# Patient Record
Sex: Female | Born: 1975 | Race: White | Hispanic: No | Marital: Married | Smoking: Former smoker
Health system: Southern US, Community
[De-identification: ages and names within clinical notes are randomized; demographics above are authoritative.]

## PROBLEM LIST (undated history)

## (undated) ENCOUNTER — Inpatient Hospital Stay (HOSPITAL_COMMUNITY): Payer: Self-pay

## (undated) DIAGNOSIS — I10 Essential (primary) hypertension: Secondary | ICD-10-CM

## (undated) DIAGNOSIS — F32A Depression, unspecified: Secondary | ICD-10-CM

## (undated) DIAGNOSIS — R87629 Unspecified abnormal cytological findings in specimens from vagina: Secondary | ICD-10-CM

## (undated) DIAGNOSIS — E785 Hyperlipidemia, unspecified: Secondary | ICD-10-CM

## (undated) DIAGNOSIS — D649 Anemia, unspecified: Secondary | ICD-10-CM

## (undated) DIAGNOSIS — T7840XA Allergy, unspecified, initial encounter: Secondary | ICD-10-CM

## (undated) HISTORY — PX: WISDOM TOOTH EXTRACTION: SHX21

## (undated) HISTORY — PX: LEEP: SHX91

## (undated) HISTORY — DX: Depression, unspecified: F32.A

## (undated) HISTORY — DX: Allergy, unspecified, initial encounter: T78.40XA

## (undated) HISTORY — DX: Hyperlipidemia, unspecified: E78.5

## (undated) HISTORY — PX: COLPOSCOPY: SHX161

## (undated) HISTORY — DX: Essential (primary) hypertension: I10

## (undated) HISTORY — DX: Unspecified abnormal cytological findings in specimens from vagina: R87.629

---

## 2005-03-27 ENCOUNTER — Other Ambulatory Visit: Admission: RE | Admit: 2005-03-27 | Discharge: 2005-03-27 | Payer: Self-pay | Admitting: Family Medicine

## 2006-04-03 ENCOUNTER — Other Ambulatory Visit: Admission: RE | Admit: 2006-04-03 | Discharge: 2006-04-03 | Payer: Self-pay | Admitting: *Deleted

## 2007-06-17 ENCOUNTER — Other Ambulatory Visit: Admission: RE | Admit: 2007-06-17 | Discharge: 2007-06-17 | Payer: Self-pay | Admitting: *Deleted

## 2008-05-07 ENCOUNTER — Other Ambulatory Visit: Admission: RE | Admit: 2008-05-07 | Discharge: 2008-05-07 | Payer: Self-pay | Admitting: Gynecology

## 2009-01-05 DIAGNOSIS — J45909 Unspecified asthma, uncomplicated: Secondary | ICD-10-CM | POA: Insufficient documentation

## 2009-05-25 ENCOUNTER — Inpatient Hospital Stay (HOSPITAL_COMMUNITY): Admission: AD | Admit: 2009-05-25 | Discharge: 2009-05-30 | Payer: Self-pay | Admitting: Obstetrics and Gynecology

## 2011-03-05 LAB — COMPREHENSIVE METABOLIC PANEL
AST: 29 U/L (ref 0–37)
Albumin: 2.4 g/dL — ABNORMAL LOW (ref 3.5–5.2)
Calcium: 8.5 mg/dL (ref 8.4–10.5)
Creatinine, Ser: 0.65 mg/dL (ref 0.4–1.2)
GFR calc Af Amer: >60 mL/min (ref >60)
GFR calc non Af Amer: >60 mL/min (ref >60)
Sodium: 134 mEq/L — ABNORMAL LOW (ref 135–145)
Total Protein: 5.9 g/dL — ABNORMAL LOW (ref 6.0–8.3)

## 2011-03-05 LAB — CBC
HCT: 26.5 % — ABNORMAL LOW (ref 36.0–46.0)
Hemoglobin: 9.2 g/dL — ABNORMAL LOW (ref 12.0–15.0)
MCHC: 35 g/dL (ref 30.0–36.0)
MCV: 91.2 fL (ref 78.0–100.0)
MCV: 91.6 fL (ref 78.0–100.0)
Platelets: 224 10*3/uL (ref 150–400)
RBC: 2.89 MIL/uL — ABNORMAL LOW (ref 3.87–5.11)
RDW: 15.5 % (ref 11.5–15.5)
WBC: 9.1 10*3/uL (ref 4.0–10.5)

## 2011-03-06 LAB — CBC
HCT: 31.6 % — ABNORMAL LOW (ref 36.0–46.0)
MCHC: 35.1 g/dL (ref 30.0–36.0)
MCV: 90.6 fL (ref 78.0–100.0)
Platelets: 293 10*3/uL (ref 150–400)
Platelets: 296 10*3/uL (ref 150–400)
RDW: 15.1 % (ref 11.5–15.5)
WBC: 9.6 10*3/uL (ref 4.0–10.5)

## 2011-03-06 LAB — URIC ACID: Uric Acid, Serum: 5.2 mg/dL (ref 2.4–7.0)

## 2011-03-06 LAB — COMPREHENSIVE METABOLIC PANEL
Alkaline Phosphatase: 123 U/L — ABNORMAL HIGH (ref 39–117)
BUN: 6 mg/dL (ref 6–23)
Glucose, Bld: 99 mg/dL (ref 70–99)
Potassium: 3.7 mEq/L (ref 3.5–5.1)
Total Bilirubin: 0.7 mg/dL (ref 0.3–1.2)
Total Protein: 6.2 g/dL (ref 6.0–8.3)

## 2011-03-06 LAB — LACTATE DEHYDROGENASE: LDH: 95 U/L (ref 94–250)

## 2011-04-11 NOTE — Op Note (Signed)
[name], [name] NO.:  [id number]   MEDICAL RECORD NO.:  [id number]          PATIENT TYPE:  INP   LOCATION:  9113                          FACILITY:  [facility]   PHYSICIAN:  Freddy Finner, M.D.   DATE OF BIRTH:  Aug 12, 1976   DATE OF PROCEDURE:  05/27/2009  DATE OF DISCHARGE:                               OPERATIVE REPORT   PREOPERATIVE DIAGNOSES:  1. Intrauterine pregnancy at [redacted] weeks gestation.  2. Chronic hypertension.  3. Mild superimposed pregnancy-induced hypertension.  4. Nonreassuring fetal heart tracing.   POSTOPERATIVE DIAGNOSES:  1. Intrauterine pregnancy at [redacted] weeks gestation.  2. Chronic hypertension.  3. Mild superimposed pregnancy-induced hypertension.  4. Nonreassuring fetal heart tracing.  5. Delivered viable female infant, Apgars of 8/9.  6. __________arterial cord pH 7.30.   ANESTHESIA:  Epidural.   ESTIMATED INTRAOPERATIVE BLOOD LOSS:  800 mL.   INTRAOPERATIVE COMPLICATIONS:  None.   The patient is a 35 year old primigravida who has been followed through  a prenatal course remarkable for chronic hypertension with third  trimester addition of antihypertensive medication secondary to mild  pregnancy-induced hypertension.  She was admitted on May 25, 2009, for  a two-stage induction and received Cytotec through the night and on the  morning May 26, 2009, was started on IV Pitocin.  At approximately 2:30  p.m., she spontaneous ruptured her membranes and progressed to complete  dilatation and early second stage.  There were periods during the first  stage of labor when the patient had moderate variables.  These responded  to IV ephedrine, positioning and later to an intrauterine transducer  with amnioinfusion.  Her examination revealed the vertex to be in a +1  station with molding and vertex in the left occiput anterior position.  This was true at approximately 11:00 p.m. and again at 12:15 a.m. on the  day of delivery when she was  reexamined.  She had, had a severe  deceleration for a period of 2-3 minutes with slow recovery.  By the  time of my arrival at the hospital, the tracing had returned to her  normal baseline.  There was still some variability, but based on the  patient's pelvis, the position of the head with molding, the concern  over a long second stage it was felt prudent to proceed with cesarean  delivery and she was brought to the operating room.  The epidural which  was in place was dosed for surgery.  Foley catheter was indwelling.  The  abdomen was prepped and draped in the usual fashion.  A low abdominal  transverse skin incision was made approximately 2 cm above the  symphysis.  This was dissected down to the fascia which was entered  sharply and extended to the extent of the skin incision.  The rectus  sheath was developed superiorly and inferiorly with blunt and sharp  dissection.  Rectus muscles divided in the midline.  Peritoneum was  elevated and entered sharply and extended bluntly to the extent of the  skin incision.  A bladder blade was placed.  The bladder was very low  and was  it elected to make a transverse incision in the lower segment  above the bladder reflection.  Amnion was entered.  Fluid was clear.  The uterine incision was dissected transversely with blunt dissection.  A viable female infant was then delivered with statistics as noted above.  This was done without difficulty.  There was no obvious intrauterine  reason for the variable decelerations.  There was no nuchal cord, no  shoulder cord, no entanglement of the cord that could be appreciated.  Arterial cord blood was obtained for sampling.  The placenta was  delivered and the placenta donated for cord blood harvesting.  The  uterus was delivered onto the anterior abdominal wall.  There was a  small subserosal myoma on the anterior surface, otherwise the uterus was  normal.  The tubes and ovaries were normal.  Her uterine  cavity was  carefully manually explored and all parts of conception removed.  The  uterine incision was then closed with a double layer with running  locking 0 Monocryl for the first layer and an imbricating suture of 0  Monocryl for the second.  The uterus was delivered back into the  abdominal cavity.  Irrigation was carried out.  Hemostasis was  confirmed.  Pack, needle and instrument counts were correct.  The  abdominal incision was then closed in layers.  Running 0 Monocryl was  used to close the peritoneum and reapproximate the rectus muscles.  The  fascia was closed with a looped 0 PDS in a running fashion.  The  subcutaneous tissue was approximated with a running 2-0 plain.  The skin  was closed with wide skin staples and quarter-inch Steri-Strips.  The  patient tolerated the operative procedure well.  She was taken to the  recovery room in good condition.      Freddy Finner, M.D.  Electronically Signed     WRN/MEDQ  D:  05/27/2009  T:  05/27/2009  Job:  [id number]

## 2011-04-11 NOTE — H&P (Signed)
[name], [name] NO.:  [id number]   MEDICAL RECORD NO.:  [id number]          PATIENT TYPE:  INP   LOCATION:                                FACILITY[facility]  [facility]   PHYSICIAN:  Freddy Finner, M.D.   DATE OF BIRTH:  May 06, 1976   DATE OF ADMISSION:  05/25/2009  DATE OF DISCHARGE:                              HISTORY & PHYSICAL   ADMITTING DIAGNOSIS:  Intrauterine pregnancy 39 weeks' gestation; mild  pregnancy-induced hypertension with overlying chronic hypertension  admitted now for induction for this indication.   The patient is a 35 year old primigravida whose prenatal course has been  remarkable for hypertension which is thought to be chronic in nature and  which progressively worsened during the course of pregnancy, at which  time she was started on labetalol 100 mg b.i.d. at 31-5/7 weeks'  gestation.  At that time, her blood pressure was 142/90.  PIH labs at  that time were essentially normal, and her pressure remained in a  moderate range until approximately 2 weeks prior to this admission.  The  day of admission, her blood pressure in the office is 148/82.  She had  1+ proteinuria.  Her most recent PIH labs have been normal but will be  repeated on admission.   REVIEW OF SYSTEMS:  At this time is pretty much negative.  She does  occasionally have a mild headache but no severe headaches.  No unusual  abdominal pain.  She does have marked peripheral edema.  She has 1+  proteinuria and the blood pressure noted above.  She is now admitted for  two-stage induction.   PAST HISTORY:  As recorded in prenatal summary and will not be repeated  here.  The patient is known to be group B Strep positive.  She is  allergic to PENICILLIN.   ADMISSION PHYSICAL EXAMINATION:  HEENT:  Grossly within normal limits.  VITAL SIGNS:  As noted above, blood pressure is 148/82.  NECK:  Thyroid gland is not palpably enlarged.  GENERAL:  The patient is moderately obese.  CHEST:  Her  chest is clear to auscultation throughout.  HEART:  Normal sinus rhythm without murmurs, rubs or gallops.  ABDOMEN:  Gravid.  Fundal height of 40 cm.  Estimated fetal size of 8  pounds.  EXTREMITIES:  There is +2 to +3 edema below the knee.  Deep tendon  reflexes +1.  PELVIC:  Cervix is closed, perhaps 25% effaced, posterior.   ASSESSMENT:  Chronic hypertension with secondary superimposed pregnancy-  induced hypertension, relative macrosomia, admitted now for two-stage  induction.  Known positive group B Strep which we will cover with  clindamycin.   PLAN:  Cytotec induction with __________ on the day following with IV  Pitocin.     Freddy Finner, M.D.  Electronically Signed    WRN/MEDQ  D:  05/25/2009  T:  05/25/2009  Job:  [id number]

## 2011-04-14 NOTE — Discharge Summary (Signed)
NAMEELVI, [name] NO.:  [id number]   MEDICAL RECORD NO.:  [id number]          PATIENT TYPE:  INP   LOCATION:  9113                          [facility]ACILITY[facility]  [facility]   PHYSICIAN:  Dineen Kid. Rana Snare, M.D.    DATE OF BIRTH:  July 20, 1976   DATE OF ADMISSION:  05/25/2009  DATE OF DISCHARGE:  05/30/2009                               DISCHARGE SUMMARY   ADMITTING DIAGNOSES:  1. Intrauterine pregnancy at 5 weeks' estimated gestational age.  2. Mild pregnancy-induced hypertension with overlying chronic      hypertension.  3. Induction of labor.   DISCHARGE DIAGNOSES:  1. Status post low transverse cesarean section secondary to      nonreassuring fetal heart tones.  2. Viable female infant.   PROCEDURE:  Primary low transverse cesarean section.   REASON FOR ADMISSION:  Please see dictated H and P.   HOSPITAL COURSE:  The patient is a 35 year old primigravida who was  admitted to [facility] for two-stage induction of labor  secondary to pregnancy-induced hypertension.  On admission, the patient  had PIH labs which were within normal limits.  Vital signs were stable.  Blood pressure 148/82.  Cervix was closed, approximately 25% effaced,  and posterior position.  The patient was also known to be positive for  group B beta strep.  She was started on some clindamycin.  On the  following morning, the patient was started on some Pitocin.  Fetal heart  tones were reactive with a low baseline.  Cervix was approximately 80%  effaced, vertex at minus 3 station.  The patient did sustain spontaneous  rupture of membranes at approximately 2:30.  fluid was clear.  Epidural  was placed at that time for her comfort.  She was at approximately 5 cm,  100% effaced, vertex at minus 1 station.  Fetal heart tones were noted  to have some variable decelerations.  Pitocin was discontinued.  The  patient was repositioned, and IV fluids were given.  Amnioinfusion was  performed.  Pitocin  was then restarted, and the patient did achieve  complete dilatation.  Vertex was noted to have some molding at  approximately 1+ station.  There continued to be some nonreassuring  fetal heart tones, and the patient was not making any further progress.  Decision was made to proceed with a primary low transverse cesarean  section.  The patient was then transferred to the operating room where  epidural was dosed to an adequate surgical level.  A low transverse  incision was made with delivery of a viable female infant with Apgars of 8  at 1 minute and 9 at 5 minutes.  Arterial cord pH was 7.30.  The patient  tolerated the procedure well and was taken to the recovery room in  stable condition.  On postoperative day 1, the patient was without  complaint.  Vital signs were stable.  She was afebrile.  Abdomen soft.  Fundus firm and nontender.  Dressing was clean, dry, and intact.  Urine  output was noted to be adequate.  On postoperative day 2, the patient  was  without complaint.  Vital signs were stable.  Blood pressures 119/79  to 127/79.  Fundus was firm and nontender.  Abdominal dressing was  clean, dry, and intact.  Laboratory findings revealed hemoglobin of 8.9,  platelet count of 224,000.  On the following morning, the patient noted  that she was feeling somewhat anxious.  She did have a history of  cigarette smoking and felt that some of her anxiety was coming from  nicotine withdrawal.  The patient was offered a nicotine patch.  She  denied any postpartum depression symptoms.  Later that afternoon, the  patient complained of a headache, rate of 6-7/10.  Blood pressure was  noted to be 162/93.  She denied any blurred vision or right upper  quadrant pain.  PIH labs were redrawn which were within normal limits.  The patient was started on some labetalol 100 mg b.i.d.  On the  following morning, the patient was without complaint.  Vital signs were  stable.  Blood pressure was 152/93.   Abdomen soft.  Fundus firm and  nontender.  Deep tendon reflexes were within normal limits.  Repeat PIH  labs were within normal limits.  Discharge instructions were reviewed.  Staples were removed, and the patient was later discharged home.   CONDITION ON DISCHARGE:  Stable.   DIET:  Regular as tolerated.   ACTIVITY:  No heavy lifting, no driving x2 weeks, no vaginal entry.   FOLLOWUP:  The patient to follow up in the office in 1 week for blood  pressure check and an incision check.  She is to call for temperature  greater than 100 degrees, persistent nausea, vomiting, heavy vaginal  bleeding, and/or redness or drainage from the incisional site.  The  patient was also instructed to call for headache, blurred vision, or  right upper quadrant pain.   DISCHARGE MEDICATIONS:  1. Tylox #30 one p.o. every 4-6 hours p.r.n.  2. Labetalol 100 mg 1 p.o. b.i.d.  3. Prenatal vitamins 1 p.o. daily.  4. Colace 1 p.o. daily p.r.n.      Julio Sicks, N.P.      Dineen Kid Rana Snare, M.D.  Electronically Signed    CC/MEDQ  D:  06/10/2009  T:  06/10/2009  Job:  [id number]

## 2011-09-08 LAB — OB RESULTS CONSOLE RPR: RPR: NONREACTIVE

## 2011-09-08 LAB — OB RESULTS CONSOLE GC/CHLAMYDIA: Chlamydia: NEGATIVE

## 2012-03-24 ENCOUNTER — Encounter (HOSPITAL_COMMUNITY): Payer: Self-pay

## 2012-03-27 ENCOUNTER — Encounter (HOSPITAL_COMMUNITY): Payer: Self-pay

## 2012-03-28 ENCOUNTER — Inpatient Hospital Stay (HOSPITAL_COMMUNITY)
Admission: AD | Admit: 2012-03-28 | Discharge: 2012-03-28 | Disposition: A | Discharge status: Hospice | Payer: 59 | Source: Ambulatory Visit | Attending: Obstetrics and Gynecology | Admitting: Obstetrics and Gynecology

## 2012-03-28 ENCOUNTER — Encounter (HOSPITAL_COMMUNITY)
Admission: RE | Admit: 2012-03-28 | Discharge: 2012-03-28 | Disposition: A | Discharge status: Home / Self Care | Payer: 59 | Source: Ambulatory Visit | Attending: Obstetrics and Gynecology | Admitting: Obstetrics and Gynecology

## 2012-03-28 ENCOUNTER — Encounter (HOSPITAL_COMMUNITY): Payer: Self-pay

## 2012-03-28 DIAGNOSIS — O99891 Other specified diseases and conditions complicating pregnancy: Secondary | ICD-10-CM | POA: Insufficient documentation

## 2012-03-28 DIAGNOSIS — Y92410 Unspecified street and highway as the place of occurrence of the external cause: Secondary | ICD-10-CM | POA: Insufficient documentation

## 2012-03-28 DIAGNOSIS — Z3689 Encounter for other specified antenatal screening: Secondary | ICD-10-CM

## 2012-03-28 DIAGNOSIS — O479 False labor, unspecified: Secondary | ICD-10-CM

## 2012-03-28 HISTORY — DX: Anemia, unspecified: D64.9

## 2012-03-28 LAB — SURGICAL PCR SCREEN: Staphylococcus aureus: INVALID — AB

## 2012-03-28 NOTE — Progress Notes (Signed)
Pre-op nurse in to discuss scheduled C/S.

## 2012-03-28 NOTE — Patient Instructions (Addendum)
YOUR PROCEDURE IS SCHEDULED ON:04/10/12  ENTER THROUGH THE MAIN ENTRANCE OF [facility] AT:1130  USE DESK PHONE AND DIAL [id number] TO INFORM us OF YOUR ARRIVAL  CALL [phone] IF YOU HAVE ANY QUESTIONS OR PROBLEMS PRIOR TO YOUR ARRIVAL.  REMEMBER: DO NOT EAT AFTER MIDNIGHT :TUESDAY  SPECIAL INSTRUCTIONS:CLEAR LIQUIDS OK UNTIL 9AM WED.   YOU MAY BRUSH YOUR TEETH THE MORNING OF SURGERY   TAKE THESE MEDICINES THE DAY OF SURGERY WITH SIP OF WATER:NONE- BRING INHALER TO HOSPITAL   DO NOT WEAR JEWELRY, EYE MAKEUP, LIPSTICK OR DARK FINGERNAIL POLISH DO NOT WEAR LOTIONS  DO NOT SHAVE FOR 48 HOURS PRIOR TO SURGERY  YOU WILL NOT BE ALLOWED TO DRIVE YOURSELF HOME.  NAME OF DRIVER:RYAN- SPOUSE
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
## 2012-03-29 ENCOUNTER — Encounter (HOSPITAL_COMMUNITY): Payer: Self-pay

## 2012-03-29 NOTE — Pre-Procedure Instructions (Signed)
Labs will need to be done stat on DOS. I didn't order them again because Dr. Renaldo Fiddler should be ordering labs, etc soon.
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
## 2012-03-30 LAB — MRSA CULTURE: Culture: NO GROWTH

## 2012-04-09 NOTE — H&P (Addendum)
35yo G5P1 @ 39+ wks presents for repeat c-section  PMHx:  Asthma, GAD PSHx:  EAB x 2, c-section x 1 All:  PCN, Keflex Meds:  PNV, ventolin prn Shx:  Negative for tobacco, ivdu, etoh   AF, VSS GEn - NAD Abd - gravid, NT Ext - edema bilaterally  A/P:  Prior c-section, desires repeat R/b/a discussed and informed consent. Plan of care discussed again, questions answered.

## 2012-04-10 ENCOUNTER — Encounter (HOSPITAL_COMMUNITY): Payer: Self-pay | Admitting: Anesthesiology

## 2012-04-10 ENCOUNTER — Inpatient Hospital Stay (HOSPITAL_COMMUNITY)
Admission: RE | Admit: 2012-04-10 | Discharge: 2012-04-13 | DRG: 766 | Disposition: A | Discharge status: Home / Self Care | Payer: 59 | Source: Ambulatory Visit | Attending: Obstetrics and Gynecology | Admitting: Obstetrics and Gynecology

## 2012-04-10 ENCOUNTER — Encounter (HOSPITAL_COMMUNITY)
Admission: RE | Disposition: A | Discharge status: Home / Self Care | Payer: Self-pay | Source: Ambulatory Visit | Attending: Obstetrics and Gynecology

## 2012-04-10 ENCOUNTER — Encounter (HOSPITAL_COMMUNITY): Payer: Self-pay | Admitting: General Surgery

## 2012-04-10 ENCOUNTER — Encounter (HOSPITAL_COMMUNITY): Payer: Self-pay | Admitting: *Deleted

## 2012-04-10 ENCOUNTER — Inpatient Hospital Stay (HOSPITAL_COMMUNITY): Payer: 59 | Admitting: Anesthesiology

## 2012-04-10 DIAGNOSIS — O34219 Maternal care for unspecified type scar from previous cesarean delivery: Principal | ICD-10-CM | POA: Diagnosis present

## 2012-04-10 LAB — CBC
HCT: 37.5 % (ref 36.0–46.0)
Hemoglobin: 12.8 g/dL (ref 12.0–15.0)
MCHC: 34.1 g/dL (ref 30.0–36.0)
RBC: 4.14 MIL/uL (ref 3.87–5.11)
WBC: 13.3 10*3/uL — ABNORMAL HIGH (ref 4.0–10.5)

## 2012-04-10 LAB — RPR: RPR Ser Ql: NONREACTIVE

## 2012-04-10 SURGERY — Surgical Case
Anesthesia: Spinal | Site: Abdomen | Wound class: Clean Contaminated

## 2012-04-10 MED ORDER — SODIUM CHLORIDE 0.9 % IV SOLN: 1.0000 ug/kg/h | INTRAVENOUS | Status: DC | PRN

## 2012-04-10 MED ORDER — OXYTOCIN 20 UNITS IN LACTATED RINGERS INFUSION - SIMPLE: 125.0000 mL/h | INTRAVENOUS | Status: AC

## 2012-04-10 MED ORDER — OXYTOCIN 10 UNIT/ML IJ SOLN
INTRAMUSCULAR | Status: AC
  Filled 2012-04-10: qty 4

## 2012-04-10 MED ORDER — DIPHENHYDRAMINE HCL 25 MG PO CAPS: 25.0000 mg | ORAL_CAPSULE | Freq: Four times a day (QID) | ORAL | Status: DC | PRN

## 2012-04-10 MED ORDER — MEPERIDINE HCL 25 MG/ML IJ SOLN: 6.2500 mg | INTRAMUSCULAR | Status: DC | PRN

## 2012-04-10 MED ORDER — SCOPOLAMINE 1 MG/3DAYS TD PT72
1.0000 | MEDICATED_PATCH | Freq: Once | TRANSDERMAL | Status: DC
  Administered 2012-04-10: 1.5 mg via TRANSDERMAL

## 2012-04-10 MED ORDER — OXYTOCIN 20 UNITS IN LACTATED RINGERS INFUSION - SIMPLE
INTRAVENOUS | Status: DC | PRN
  Administered 2012-04-10: 20 [IU] via INTRAVENOUS

## 2012-04-10 MED ORDER — SIMETHICONE 80 MG PO CHEW
80.0000 mg | CHEWABLE_TABLET | ORAL | Status: DC | PRN
  Administered 2012-04-10: 80 mg via ORAL

## 2012-04-10 MED ORDER — LACTATED RINGERS IV SOLN
INTRAVENOUS | Status: DC
  Administered 2012-04-10: 13:00:00 via INTRAVENOUS

## 2012-04-10 MED ORDER — SCOPOLAMINE 1 MG/3DAYS TD PT72
MEDICATED_PATCH | TRANSDERMAL | Status: AC
  Administered 2012-04-10: 1.5 mg via TRANSDERMAL
  Filled 2012-04-10: qty 1

## 2012-04-10 MED ORDER — ONDANSETRON HCL 4 MG/2ML IJ SOLN: 4.0000 mg | INTRAMUSCULAR | Status: DC | PRN

## 2012-04-10 MED ORDER — BUPIVACAINE IN DEXTROSE 0.75-8.25 % IT SOLN
INTRATHECAL | Status: DC | PRN
  Administered 2012-04-10: 11.75 mg via INTRATHECAL

## 2012-04-10 MED ORDER — FENTANYL CITRATE 0.05 MG/ML IJ SOLN
INTRAMUSCULAR | Status: DC | PRN
  Administered 2012-04-10: 15 ug via INTRATHECAL

## 2012-04-10 MED ORDER — IBUPROFEN 600 MG PO TABS
600.0000 mg | ORAL_TABLET | Freq: Four times a day (QID) | ORAL | Status: DC
  Administered 2012-04-11 – 2012-04-13 (×10): 600 mg via ORAL
  Filled 2012-04-10 (×10): qty 1

## 2012-04-10 MED ORDER — 0.9 % SODIUM CHLORIDE (POUR BTL) OPTIME
TOPICAL | Status: DC | PRN
  Administered 2012-04-10: 1000 mL

## 2012-04-10 MED ORDER — ONDANSETRON HCL 4 MG PO TABS: 4.0000 mg | ORAL_TABLET | ORAL | Status: DC | PRN

## 2012-04-10 MED ORDER — DIPHENHYDRAMINE HCL 25 MG PO CAPS: 25.0000 mg | ORAL_CAPSULE | ORAL | Status: DC | PRN

## 2012-04-10 MED ORDER — SCOPOLAMINE 1 MG/3DAYS TD PT72: 1.0000 | MEDICATED_PATCH | Freq: Once | TRANSDERMAL | Status: DC

## 2012-04-10 MED ORDER — MORPHINE SULFATE (PF) 0.5 MG/ML IJ SOLN
INTRAMUSCULAR | Status: DC | PRN
  Administered 2012-04-10: .1 mg via INTRATHECAL

## 2012-04-10 MED ORDER — ONDANSETRON HCL 4 MG/2ML IJ SOLN
INTRAMUSCULAR | Status: AC
  Filled 2012-04-10: qty 2

## 2012-04-10 MED ORDER — ONDANSETRON HCL 4 MG/2ML IJ SOLN
INTRAMUSCULAR | Status: DC | PRN
  Administered 2012-04-10: 4 mg via INTRAVENOUS

## 2012-04-10 MED ORDER — LACTATED RINGERS IV SOLN
INTRAVENOUS | Status: DC | PRN
  Administered 2012-04-10 (×2): via INTRAVENOUS

## 2012-04-10 MED ORDER — SODIUM CHLORIDE 0.9 % IJ SOLN: 3.0000 mL | INTRAMUSCULAR | Status: DC | PRN

## 2012-04-10 MED ORDER — KETOROLAC TROMETHAMINE 60 MG/2ML IM SOLN
60.0000 mg | Freq: Once | INTRAMUSCULAR | Status: AC | PRN
  Administered 2012-04-10: 60 mg via INTRAMUSCULAR

## 2012-04-10 MED ORDER — TETANUS-DIPHTH-ACELL PERTUSSIS 5-2.5-18.5 LF-MCG/0.5 IM SUSP: 0.5000 mL | Freq: Once | INTRAMUSCULAR | Status: DC

## 2012-04-10 MED ORDER — PHENYLEPHRINE HCL 10 MG/ML IJ SOLN
INTRAMUSCULAR | Status: DC | PRN
  Administered 2012-04-10: 80 ug via INTRAVENOUS
  Administered 2012-04-10: 40 ug via INTRAVENOUS
  Administered 2012-04-10 (×3): 80 ug via INTRAVENOUS
  Administered 2012-04-10: 40 ug via INTRAVENOUS

## 2012-04-10 MED ORDER — KETOROLAC TROMETHAMINE 30 MG/ML IJ SOLN: 30.0000 mg | Freq: Four times a day (QID) | INTRAMUSCULAR | Status: AC | PRN

## 2012-04-10 MED ORDER — MEASLES, MUMPS & RUBELLA VAC ~~LOC~~ INJ: 0.5000 mL | INJECTION | Freq: Once | SUBCUTANEOUS | Status: DC

## 2012-04-10 MED ORDER — FENTANYL CITRATE 0.05 MG/ML IJ SOLN
25.0000 ug | INTRAMUSCULAR | Status: DC | PRN
  Administered 2012-04-10 (×2): 50 ug via INTRAVENOUS

## 2012-04-10 MED ORDER — PHENYLEPHRINE 40 MCG/ML (10ML) SYRINGE FOR IV PUSH (FOR BLOOD PRESSURE SUPPORT)
PREFILLED_SYRINGE | INTRAVENOUS | Status: AC
  Filled 2012-04-10: qty 5

## 2012-04-10 MED ORDER — LANOLIN HYDROUS EX OINT: 1.0000 "application " | TOPICAL_OINTMENT | CUTANEOUS | Status: DC | PRN

## 2012-04-10 MED ORDER — OXYCODONE-ACETAMINOPHEN 5-325 MG PO TABS
1.0000 | ORAL_TABLET | ORAL | Status: DC | PRN
  Administered 2012-04-11 (×5): 2 via ORAL
  Administered 2012-04-12: 1 via ORAL
  Administered 2012-04-12: 2 via ORAL
  Administered 2012-04-12 (×2): 1 via ORAL
  Filled 2012-04-10 (×2): qty 2
  Filled 2012-04-10: qty 1
  Filled 2012-04-10: qty 2
  Filled 2012-04-10: qty 1
  Filled 2012-04-10 (×2): qty 2
  Filled 2012-04-10 (×3): qty 1

## 2012-04-10 MED ORDER — METOCLOPRAMIDE HCL 5 MG/ML IJ SOLN: 10.0000 mg | Freq: Three times a day (TID) | INTRAMUSCULAR | Status: DC | PRN

## 2012-04-10 MED ORDER — WITCH HAZEL-GLYCERIN EX PADS
1.0000 | MEDICATED_PAD | CUTANEOUS | Status: DC | PRN
Start: 2012-04-10 — End: 2012-04-13

## 2012-04-10 MED ORDER — NALBUPHINE HCL 10 MG/ML IJ SOLN: 5.0000 mg | INTRAMUSCULAR | Status: DC | PRN

## 2012-04-10 MED ORDER — SENNOSIDES-DOCUSATE SODIUM 8.6-50 MG PO TABS
2.0000 | ORAL_TABLET | Freq: Every day | ORAL | Status: DC
  Administered 2012-04-11 – 2012-04-12 (×2): 2 via ORAL

## 2012-04-10 MED ORDER — ALBUTEROL SULFATE HFA 108 (90 BASE) MCG/ACT IN AERS: 2.0000 | INHALATION_SPRAY | Freq: Four times a day (QID) | RESPIRATORY_TRACT | Status: DC | PRN

## 2012-04-10 MED ORDER — DIPHENHYDRAMINE HCL 50 MG/ML IJ SOLN: 25.0000 mg | INTRAMUSCULAR | Status: DC | PRN

## 2012-04-10 MED ORDER — VANCOMYCIN HCL 1000 MG IV SOLR
1000.0000 mg | INTRAVENOUS | Status: DC | PRN
  Administered 2012-04-10: 1000 mg via INTRAVENOUS

## 2012-04-10 MED ORDER — DIBUCAINE 1 % RE OINT: 1.0000 "application " | TOPICAL_OINTMENT | RECTAL | Status: DC | PRN

## 2012-04-10 MED ORDER — FENTANYL CITRATE 0.05 MG/ML IJ SOLN
INTRAMUSCULAR | Status: AC
  Administered 2012-04-10: 50 ug via INTRAVENOUS
  Filled 2012-04-10: qty 2

## 2012-04-10 MED ORDER — DEXTROSE IN LACTATED RINGERS 5 % IV SOLN: INTRAVENOUS | Status: DC

## 2012-04-10 MED ORDER — NALOXONE HCL 0.4 MG/ML IJ SOLN: 0.4000 mg | INTRAMUSCULAR | Status: DC | PRN

## 2012-04-10 MED ORDER — FENTANYL CITRATE 0.05 MG/ML IJ SOLN
INTRAMUSCULAR | Status: AC
  Filled 2012-04-10: qty 4

## 2012-04-10 MED ORDER — MEDROXYPROGESTERONE ACETATE 150 MG/ML IM SUSP: 150.0000 mg | INTRAMUSCULAR | Status: DC | PRN

## 2012-04-10 MED ORDER — KETOROLAC TROMETHAMINE 30 MG/ML IJ SOLN
30.0000 mg | Freq: Four times a day (QID) | INTRAMUSCULAR | Status: AC | PRN
  Administered 2012-04-10: 30 mg via INTRAVENOUS
  Filled 2012-04-10: qty 1

## 2012-04-10 MED ORDER — ONDANSETRON HCL 4 MG/2ML IJ SOLN: 4.0000 mg | Freq: Three times a day (TID) | INTRAMUSCULAR | Status: DC | PRN

## 2012-04-10 MED ORDER — KETOROLAC TROMETHAMINE 60 MG/2ML IM SOLN
INTRAMUSCULAR | Status: AC
  Administered 2012-04-10: 60 mg via INTRAMUSCULAR
  Filled 2012-04-10: qty 2

## 2012-04-10 MED ORDER — VANCOMYCIN HCL IN DEXTROSE 1-5 GM/200ML-% IV SOLN
1000.0000 mg | Freq: Once | INTRAVENOUS | Status: DC
  Filled 2012-04-10: qty 200

## 2012-04-10 MED ORDER — PNEUMOCOCCAL VAC POLYVALENT 25 MCG/0.5ML IJ INJ
0.5000 mL | INJECTION | INTRAMUSCULAR | Status: AC
  Administered 2012-04-11: 0.5 mL via INTRAMUSCULAR
  Filled 2012-04-10: qty 0.5

## 2012-04-10 MED ORDER — SIMETHICONE 80 MG PO CHEW
80.0000 mg | CHEWABLE_TABLET | Freq: Three times a day (TID) | ORAL | Status: DC
  Administered 2012-04-11 – 2012-04-13 (×9): 80 mg via ORAL

## 2012-04-10 MED ORDER — EPHEDRINE 5 MG/ML INJ
INTRAVENOUS | Status: AC
  Filled 2012-04-10: qty 10

## 2012-04-10 MED ORDER — IBUPROFEN 600 MG PO TABS: 600.0000 mg | ORAL_TABLET | Freq: Four times a day (QID) | ORAL | Status: DC | PRN

## 2012-04-10 MED ORDER — PRENATAL MULTIVITAMIN CH
1.0000 | ORAL_TABLET | Freq: Every day | ORAL | Status: DC
  Administered 2012-04-11 – 2012-04-13 (×3): 1 via ORAL
  Filled 2012-04-10 (×3): qty 1

## 2012-04-10 MED ORDER — MENTHOL 3 MG MT LOZG: 1.0000 | LOZENGE | OROMUCOSAL | Status: DC | PRN

## 2012-04-10 MED ORDER — OXYTOCIN 20 UNITS IN LACTATED RINGERS INFUSION - SIMPLE
INTRAVENOUS | Status: AC
  Administered 2012-04-10: 20 [IU]
  Filled 2012-04-10: qty 1000

## 2012-04-10 MED ORDER — DIPHENHYDRAMINE HCL 50 MG/ML IJ SOLN: 12.5000 mg | INTRAMUSCULAR | Status: DC | PRN

## 2012-04-10 MED ORDER — MORPHINE SULFATE 0.5 MG/ML IJ SOLN
INTRAMUSCULAR | Status: AC
  Filled 2012-04-10: qty 10

## 2012-04-10 SURGICAL SUPPLY — 29 items
CHLORAPREP W/TINT 26ML (MISCELLANEOUS) ×2 IMPLANT
CLOTH BEACON ORANGE TIMEOUT ST (SAFETY) ×2 IMPLANT
DRSG COVADERM 4X10 (GAUZE/BANDAGES/DRESSINGS) ×1 IMPLANT
ELECT REM PT RETURN 9FT ADLT (ELECTROSURGICAL) ×2
ELECTRODE REM PT RTRN 9FT ADLT (ELECTROSURGICAL) ×1 IMPLANT
EXTRACTOR VACUUM M CUP 4 TUBE (SUCTIONS) IMPLANT
GLOVE BIO SURGEON STRL SZ 6.5 (GLOVE) ×2 IMPLANT
GLOVE BIOGEL PI IND STRL 7.0 (GLOVE) ×2 IMPLANT
GLOVE BIOGEL PI INDICATOR 7.0 (GLOVE) ×2
GOWN PREVENTION PLUS LG XLONG (DISPOSABLE) ×6 IMPLANT
GOWN STRL REIN XL XLG (GOWN DISPOSABLE) ×2 IMPLANT
KIT ABG SYR 3ML LUER SLIP (SYRINGE) ×2 IMPLANT
NDL HYPO 25X5/8 SAFETYGLIDE (NEEDLE) ×1 IMPLANT
NEEDLE HYPO 25X5/8 SAFETYGLIDE (NEEDLE) ×2 IMPLANT
NS IRRIG 1000ML POUR BTL (IV SOLUTION) ×2 IMPLANT
PACK C SECTION WH (CUSTOM PROCEDURE TRAY) ×2 IMPLANT
SLEEVE SCD COMPRESS KNEE MED (MISCELLANEOUS) IMPLANT
STAPLER VISISTAT 35W (STAPLE) ×1 IMPLANT
SUT CHROMIC 0 CT 802H (SUTURE) IMPLANT
SUT CHROMIC 0 CTX 36 (SUTURE) ×6 IMPLANT
SUT CHROMIC 1 CTX 36 (SUTURE) ×1 IMPLANT
SUT MNCRL AB 3-0 PS2 27 (SUTURE) IMPLANT
SUT MON AB-0 CT1 36 (SUTURE) ×2 IMPLANT
SUT PDS AB 0 CTX 60 (SUTURE) ×2 IMPLANT
SUT PLAIN 0 NONE (SUTURE) IMPLANT
SUT PLAIN 2 0 XLH (SUTURE) ×1 IMPLANT
TOWEL OR 17X24 6PK STRL BLUE (TOWEL DISPOSABLE) ×4 IMPLANT
TRAY FOLEY CATH 14FR (SET/KITS/TRAYS/PACK) ×1 IMPLANT
WATER STERILE IRR 1000ML POUR (IV SOLUTION) ×1 IMPLANT

## 2012-04-10 NOTE — Anesthesia Preprocedure Evaluation (Addendum)
Anesthesia Evaluation  Patient identified by MRN, date of birth, ID band Patient awake    Reviewed: Allergy & Precautions, H&P , NPO status , Patient's Chart, lab work & pertinent test results, reviewed documented beta blocker date and time   History of Anesthesia Complications Negative for: history of anesthetic complications  Airway Mallampati: III TM Distance: >3 FB Neck ROM: full    Dental  (+) Chipped   Pulmonary asthma (recent increase in inhaler use due to allergies about 2x/day) ,  breath sounds clear to auscultation        Cardiovascular hypertension (recently elevated pressures, not diagnosed with HTN), Rhythm:regular Rate:Normal     Neuro/Psych negative neurological ROS  negative psych ROS   GI/Hepatic negative GI ROS, Neg liver ROS,   Endo/Other  Morbid obesity  Renal/GU negative Renal ROS  negative genitourinary   Musculoskeletal   Abdominal   Peds  Hematology negative hematology ROS (+)   Anesthesia Other Findings   Reproductive/Obstetrics (+) Pregnancy (h/o c/s x1)                          Anesthesia Physical Anesthesia Plan  ASA: III  Anesthesia Plan: Spinal   Post-op Pain Management:    Induction:   Airway Management Planned:   Additional Equipment:   Intra-op Plan:   Post-operative Plan:   Informed Consent: I have reviewed the patients History and Physical, chart, labs and discussed the procedure including the risks, benefits and alternatives for the proposed anesthesia with the patient or authorized representative who has indicated his/her understanding and acceptance.     Plan Discussed with: Surgeon and CRNA  Anesthesia Plan Comments:        Anesthesia Quick Evaluation

## 2012-04-10 NOTE — Transfer of Care (Signed)
Immediate Anesthesia Transfer of Care Note  Patient: [name]  Procedure(s) Performed: Procedure(s) (LRB): CESAREAN SECTION (N/A)  Patient Location: PACU  Anesthesia Type: Spinal  Level of Consciousness: awake, alert  and oriented  Airway & Oxygen Therapy: Patient Spontanous Breathing  Post-op Assessment: Report given to PACU RN and Post -op Vital signs reviewed and stable  Post vital signs: Reviewed and stable  Complications: No apparent anesthesia complications

## 2012-04-10 NOTE — Op Note (Signed)
Cesarean Section Procedure Note   [name]  04/10/2012  Indications: Scheduled Proceedure/Maternal Request   Pre-operative Diagnosis: repeat.   Post-operative Diagnosis: Same   Surgeon: Surgeon(s) and Role:    * Zelphia Cairo, MD - Primary   Assistants: none  Anesthesia: spinal   Procedure Details:  The patient was seen in the Holding Room. The risks, benefits, complications, treatment options, and expected outcomes were discussed with the patient. The patient concurred with the proposed plan, giving informed consent. identified as [name] and the procedure verified as C-Section Delivery. A Time Out was held and the above information confirmed.  After induction of anesthesia, the patient was draped and prepped in the usual sterile manner. A transverse was made and carried down through the subcutaneous tissue to the fascia. Fascial incision was made and extended transversely. The fascia was separated from the underlying rectus tissue superiorly and inferiorly. The peritoneum was identified and entered. Peritoneal incision was extended longitudinally. The utero-vesical peritoneal reflection was incised transversely and the bladder flap was bluntly freed from the lower uterine segment. A low transverse uterine incision was made. Delivered from cephalic presentation was a female infant with Apgar scores of 9 at one minute and 9 at five minutes. Cord ph was not sent the umbilical cord was clamped and cut cord blood was obtained for evaluation. The placenta was removed Intact and appeared normal. The uterine outline, tubes and ovaries appeared normal}. The uterine incision was closed with running locked sutures of 0chromic gut.   Hemostasis was observed. Lavage was carried out until clear. The fascia was then reapproximated with running sutures of 0PDS. The subcuticular closure was performed using 2-0plain gut. The skin was closed with staples Instrument, sponge, and needle counts  were correct prior the abdominal closure and were correct at the conclusion of the case.     Estimated Blood Loss: 500cc    Urine Output: clear  Specimens: @ORSPECIMEN @   Complications: no complications  Disposition: PACU - hemodynamically stable.   Maternal Condition: stable   Baby condition / location:  nursery-stable  Attending Attestation: I was present and scrubbed for the entire procedure.   Signed: Surgeon(s): Zelphia Cairo, MD

## 2012-04-10 NOTE — Anesthesia Postprocedure Evaluation (Signed)
Anesthesia Post Note  Patient: [name]  Procedure(s) Performed: Procedure(s) (LRB): CESAREAN SECTION (N/A)  Anesthesia type: Spinal  Patient location: PACU  Post pain: Pain level controlled  Post assessment: Post-op Vital signs reviewed  Last Vitals:  Filed Vitals:   04/10/12 1445  BP: 146/79  Pulse: 65  Temp: 36.5 C  Resp: 20    Post vital signs: Reviewed  Level of consciousness: awake  Complications: No apparent anesthesia complications

## 2012-04-10 NOTE — Anesthesia Procedure Notes (Signed)
Spinal  Patient location during procedure: OR Start time: 04/10/2012 12:50 PM Staffing Performed by: anesthesiologist  Preanesthetic Checklist Completed: patient identified, site marked, surgical consent, pre-op evaluation, timeout performed, IV checked, risks and benefits discussed and monitors and equipment checked Spinal Block Patient position: sitting Prep: site prepped and draped and DuraPrep Patient monitoring: heart rate, cardiac monitor, continuous pulse ox and blood pressure Approach: midline Location: L3-4 Injection technique: single-shot Needle Needle type: Sprotte  Needle gauge: 24 G Needle length: 9 cm Assessment Sensory level: T4 Additional Notes Clear free flow CSF on first attempt.  No paresthesia.  Patient tolerated procedure well.  Jasmine December, MD

## 2012-04-11 ENCOUNTER — Encounter (HOSPITAL_COMMUNITY): Payer: Self-pay | Admitting: Obstetrics and Gynecology

## 2012-04-11 LAB — CBC
MCV: 92 fL (ref 78.0–100.0)
Platelets: 216 10*3/uL (ref 150–400)
RBC: 3.23 MIL/uL — ABNORMAL LOW (ref 3.87–5.11)
WBC: 12.2 10*3/uL — ABNORMAL HIGH (ref 4.0–10.5)

## 2012-04-11 MED ORDER — LORATADINE 10 MG PO TABS
10.0000 mg | ORAL_TABLET | Freq: Every day | ORAL | Status: DC
  Administered 2012-04-11 – 2012-04-13 (×3): 10 mg via ORAL
  Filled 2012-04-11 (×3): qty 1

## 2012-04-11 NOTE — Anesthesia Postprocedure Evaluation (Deleted)
  Anesthesia Post-op Note  Patient: [name]  Procedure(s) Performed: Procedure(s) (LRB): CESAREAN SECTION (N/A)  Patient Location: PACU and Mother/Baby  Anesthesia Type: Spinal  Level of Consciousness: awake, alert  and oriented  Airway and Oxygen Therapy: Patient Spontanous Breathing  Post-op Pain: mild  Post-op Assessment: Patient's Cardiovascular Status Stable, Respiratory Function Stable, No signs of Nausea or vomiting and Pain level controlled  Post-op Vital Signs: stable  Complications: No apparent anesthesia complications

## 2012-04-11 NOTE — Progress Notes (Signed)
Patient ID: [name], female   DOB: [birth year], 36 y.o.   MRN: [id number] Risk of circumcision discussed with parents.  Circumcision performed using a Gomco and 1%xylocaine block without complications.

## 2012-04-11 NOTE — Addendum Note (Signed)
Addendum  created 04/11/12 0920 by Elbert Ewings, CRNA   Modules edited:Notes Section

## 2012-04-11 NOTE — Addendum Note (Signed)
Addendum  created 04/11/12 0981 by Elbert Ewings, CRNA   Modules edited:Notes Section

## 2012-04-11 NOTE — Addendum Note (Signed)
Addendum  created 04/11/12 0826 by Rian Busche S Ikia Cincotta, CRNA   Modules edited:Notes Section    

## 2012-04-11 NOTE — Progress Notes (Signed)
Subjective: Postpartum Day 1: Cesarean Delivery Patient reports tolerating PO.  Seasonal allergies  Objective: Vital signs in last 24 hours: Temp:  [96.2 F (35.7 C)-98.5 F (36.9 C)] 96.2 F (35.7 C) (05/16 0200) Pulse Rate:  [49-83] 53  (05/16 0200) Resp:  [12-24] 20  (05/16 0200) BP: (127-167)/(78-97) 134/80 mmHg (05/16 0200) SpO2:  [97 %-100 %] 98 % (05/16 0200) Weight:  [98.884 kg (218 lb)] 98.884 kg (218 lb) (05/15 1700)  Physical Exam:  General: alert and cooperative Lochia: appropriate Uterine Fundus: firm Incision: abd dressing noted with old drainage noted on bandage DVT Evaluation: No evidence of DVT seen on physical exam.   Basename 04/11/12 0550 04/10/12 1147  HGB 9.9* 12.8  HCT 29.7* 37.5    Assessment/Plan: Status post Cesarean section. Doing well postoperatively.  Continue current care.  Diany Formosa G 04/11/2012, 8:03 AM

## 2012-04-11 NOTE — Anesthesia Postprocedure Evaluation (Deleted)
  Anesthesia Post-op Note  Patient: [name]  Procedure(s) Performed: Procedure(s) (LRB): CESAREAN SECTION (N/A)  Patient Location: PACU and Mother/Baby  Anesthesia Type: Epidural  Level of Consciousness: awake, alert  and oriented  Airway and Oxygen Therapy: Patient Spontanous Breathing  Post-op Pain: mild  Post-op Assessment: Patient's Cardiovascular Status Stable, Respiratory Function Stable, No signs of Nausea or vomiting and Pain level controlled  Post-op Vital Signs: Reviewed  Complications: No apparent anesthesia complications

## 2012-04-11 NOTE — Anesthesia Postprocedure Evaluation (Signed)
  Anesthesia Post-op Note  Patient: [name]  Procedure(s) Performed: Procedure(s) (LRB): CESAREAN SECTION (N/A)  Patient Location: PACU and Mother/Baby  Anesthesia Type: Spinal  Level of Consciousness: awake, alert , oriented and patient cooperative  Airway and Oxygen Therapy: Patient Spontanous Breathing  Post-op Pain: mild  Post-op Assessment: Patient's Cardiovascular Status Stable, Respiratory Function Stable, No signs of Nausea or vomiting and Pain level controlled  Post-op Vital Signs: stable  Complications: No apparent anesthesia complications

## 2012-04-12 LAB — COMPREHENSIVE METABOLIC PANEL
Alkaline Phosphatase: 106 U/L (ref 39–117)
BUN: 7 mg/dL (ref 6–23)
CO2: 24 mEq/L (ref 19–32)
Chloride: 99 mEq/L (ref 96–112)
GFR calc Af Amer: >90 mL/min (ref >90)
Glucose, Bld: 127 mg/dL — ABNORMAL HIGH (ref 70–99)
Potassium: 3.4 mEq/L — ABNORMAL LOW (ref 3.5–5.1)
Total Bilirubin: 0.2 mg/dL — ABNORMAL LOW (ref 0.3–1.2)

## 2012-04-12 LAB — CBC
HCT: 30.7 % — ABNORMAL LOW (ref 36.0–46.0)
Hemoglobin: 10 g/dL — ABNORMAL LOW (ref 12.0–15.0)
WBC: 10.6 10*3/uL — ABNORMAL HIGH (ref 4.0–10.5)

## 2012-04-12 MED ORDER — BISACODYL 10 MG RE SUPP
10.0000 mg | Freq: Once | RECTAL | Status: DC
  Filled 2012-04-12: qty 1

## 2012-04-12 MED ORDER — LOPERAMIDE HCL 2 MG PO CAPS
2.0000 mg | ORAL_CAPSULE | ORAL | Status: DC | PRN
  Filled 2012-04-12: qty 1

## 2012-04-12 MED ORDER — LOPERAMIDE HCL 2 MG PO CAPS
4.0000 mg | ORAL_CAPSULE | Freq: Once | ORAL | Status: DC
  Filled 2012-04-12: qty 2

## 2012-04-12 NOTE — Progress Notes (Signed)
Subjective: Postpartum Day 2: Cesarean Delivery Patient reports tolerating PO, + flatus and no problems voiding.  Complains of HA. No RUQ pain. H/O of PIH postpartum with 1st pregnancy  Objective: Vital signs in last 24 hours: Temp:  [97.4 F (36.3 C)-98.5 F (36.9 C)] 98.5 F (36.9 C) (05/17 0555) Pulse Rate:  [50-65] 65  (05/17 0555) Resp:  [18-20] 20  (05/17 0555) BP: (128-134)/(80-88) 130/81 mmHg (05/17 0555) SpO2:  [98 %-100 %] 98 % (05/16 1400)  Physical Exam:  General: alert and cooperative Lochia: appropriate Uterine Fundus: firm Incision: healing well, staples intact DVT Evaluation: No evidence of DVT seen on physical exam. DTR's 2+ small pedal edema  Basename 04/11/12 0550 04/10/12 1147  HGB 9.9* 12.8  HCT 29.7* 37.5    Assessment/Plan: Status post Cesarean section. Postoperative course complicated by HA with H/O PIH  PIH labs drawn.  Sharnay Cashion G 04/12/2012, 8:14 AM

## 2012-04-13 MED ORDER — OXYCODONE-ACETAMINOPHEN 5-325 MG PO TABS: 1.0000 | ORAL_TABLET | Freq: Four times a day (QID) | ORAL | Status: AC | PRN

## 2012-04-13 MED ORDER — IBUPROFEN 600 MG PO TABS: 600.0000 mg | ORAL_TABLET | Freq: Four times a day (QID) | ORAL | Status: AC

## 2012-04-13 NOTE — Discharge Summary (Signed)
Obstetric Discharge Summary Reason for Admission: cesarean section Prenatal Procedures: none Intrapartum Procedures: cesarean: low cervical, transverse Postpartum Procedures: none Complications-Operative and Postpartum: none Hemoglobin  Date Value Range Status  04/12/2012 10.0* 12.0-15.0 (g/dL) Final     HCT  Date Value Range Status  04/12/2012 30.7* 36.0-46.0 (%) Final    Physical Exam:  General: alert Lochia: appropriate Uterine Fundus: firm Incision: healing well DVT Evaluation: No evidence of DVT seen on physical exam.  Discharge Diagnoses: Term Pregnancy-delivered  Discharge Information: Date: 04/13/2012 Activity: pelvic rest Diet: routine Medications: Ibuprofen and Percocet Condition: stable Instructions: refer to practice specific booklet Discharge to: home Follow-up Information    Follow up with Meriel Pica, MD. Schedule an appointment as soon as possible for a visit in 1 week.   Contact information:   [address] [place] [address] [phone]          Newborn Data: Live born female  Birth Weight: 6 lb 7 oz (2920 g) APGAR: 9, 9  Home with mother.  Jacksen Isip M 04/13/2012, 10:24 AM

## 2012-04-13 NOTE — Progress Notes (Signed)
Subjective: Postpartum Day 3: Cesarean Delivery Patient reports tolerating PO.    Objective: Vital signs in last 24 hours: Temp:  [97.6 F (36.4 C)-99 F (37.2 C)] 97.6 F (36.4 C) (05/18 0550) Pulse Rate:  [64-92] 64  (05/18 0840) Resp:  [18-20] 18  (05/18 0550) BP: (133-163)/(79-105) 146/84 mmHg (05/18 0840)  Physical Exam:  General: alert Lochia: appropriate Uterine Fundus: firm Incision: healing well DVT Evaluation: No evidence of DVT seen on physical exam.   Basename 04/12/12 0838 04/11/12 0550  HGB 10.0* 9.9*  HCT 30.7* 29.7*    Assessment/Plan: Status post Cesarean section. Doing well postoperatively.  DC>>office 2-3 days to remove staples.  Meriel Pica 04/13/2012, 10:22 AM

## 2012-08-21 ENCOUNTER — Other Ambulatory Visit: Payer: Self-pay | Admitting: Family Medicine

## 2012-08-22 NOTE — Telephone Encounter (Signed)
Chart pulled to PA pool at nurse station DOS 06/11/11

## 2014-01-05 DIAGNOSIS — F411 Generalized anxiety disorder: Secondary | ICD-10-CM | POA: Insufficient documentation

## 2014-01-05 DIAGNOSIS — J309 Allergic rhinitis, unspecified: Secondary | ICD-10-CM | POA: Insufficient documentation

## 2014-01-05 DIAGNOSIS — I10 Essential (primary) hypertension: Secondary | ICD-10-CM | POA: Insufficient documentation

## 2014-09-28 ENCOUNTER — Encounter (HOSPITAL_COMMUNITY): Payer: Self-pay | Admitting: Obstetrics and Gynecology

## 2015-04-07 DIAGNOSIS — F902 Attention-deficit hyperactivity disorder, combined type: Secondary | ICD-10-CM | POA: Insufficient documentation

## 2016-12-11 DIAGNOSIS — F17210 Nicotine dependence, cigarettes, uncomplicated: Secondary | ICD-10-CM | POA: Insufficient documentation

## 2018-03-22 DIAGNOSIS — Z203 Contact with and (suspected) exposure to rabies: Secondary | ICD-10-CM | POA: Insufficient documentation

## 2018-06-17 ENCOUNTER — Emergency Department (INDEPENDENT_AMBULATORY_CARE_PROVIDER_SITE_OTHER)
Admission: EM | Admit: 2018-06-17 | Discharge: 2018-06-17 | Disposition: A | Discharge status: Home / Self Care | Payer: 59 | Source: Home / Self Care | Attending: Family Medicine | Admitting: Family Medicine

## 2018-06-17 ENCOUNTER — Encounter: Payer: Self-pay | Admitting: Emergency Medicine

## 2018-06-17 ENCOUNTER — Emergency Department (INDEPENDENT_AMBULATORY_CARE_PROVIDER_SITE_OTHER): Payer: 59

## 2018-06-17 DIAGNOSIS — B9789 Other viral agents as the cause of diseases classified elsewhere: Secondary | ICD-10-CM

## 2018-06-17 DIAGNOSIS — J4521 Mild intermittent asthma with (acute) exacerbation: Secondary | ICD-10-CM

## 2018-06-17 DIAGNOSIS — R05 Cough: Secondary | ICD-10-CM | POA: Diagnosis not present

## 2018-06-17 DIAGNOSIS — F17200 Nicotine dependence, unspecified, uncomplicated: Secondary | ICD-10-CM | POA: Diagnosis not present

## 2018-06-17 DIAGNOSIS — J069 Acute upper respiratory infection, unspecified: Secondary | ICD-10-CM

## 2018-06-17 DIAGNOSIS — R0602 Shortness of breath: Secondary | ICD-10-CM | POA: Diagnosis not present

## 2018-06-17 MED ORDER — METHYLPREDNISOLONE ACETATE 80 MG/ML IJ SUSP
80.0000 mg | Freq: Once | INTRAMUSCULAR | Status: AC
  Administered 2018-06-17: 80 mg via INTRAMUSCULAR

## 2018-06-17 MED ORDER — PREDNISONE 20 MG PO TABS: ORAL_TABLET | ORAL | 0 refills | Status: DC

## 2018-06-17 MED ORDER — ALBUTEROL SULFATE HFA 108 (90 BASE) MCG/ACT IN AERS: 1.0000 | INHALATION_SPRAY | Freq: Four times a day (QID) | RESPIRATORY_TRACT | 0 refills | Status: DC | PRN

## 2018-06-17 MED ORDER — BENZONATATE 100 MG PO CAPS: 100.0000 mg | ORAL_CAPSULE | Freq: Three times a day (TID) | ORAL | 0 refills | Status: DC

## 2018-06-17 MED ORDER — AZITHROMYCIN 250 MG PO TABS: 250.0000 mg | ORAL_TABLET | Freq: Every day | ORAL | 0 refills | Status: DC

## 2018-06-17 MED ORDER — IPRATROPIUM-ALBUTEROL 0.5-2.5 (3) MG/3ML IN SOLN
3.0000 mL | Freq: Four times a day (QID) | RESPIRATORY_TRACT | Status: DC
  Administered 2018-06-17: 3 mL via RESPIRATORY_TRACT

## 2018-06-17 NOTE — Discharge Instructions (Signed)
°  Your symptoms are likely due to a virus such as the common cold, however, if you developing worsening chest congestion with shortness of breath, persistent fever (> 100.4*F) for 3 days, or symptoms not improving in 4-5 days, you may fill the antibiotic (azithromycin).  If you do fill the antibiotic,  please take antibiotics as prescribed and be sure to complete entire course even if you start to feel better to ensure infection does not come back. ° °

## 2018-06-17 NOTE — ED Triage Notes (Signed)
Pt c/o cough with mucous and SOB x1 week. States husband and children all have cough as well. Taking mucinex and claritin.

## 2018-06-17 NOTE — ED Provider Notes (Signed)
[name]    CSN: [id number] Arrival date & time: 06/17/18  1839     History   Chief Complaint Chief Complaint  Patient presents with  . Cough    HPI [name] is a 42 y.o. female.   HPI  [name] is a 42 y.o. female presenting to UC with c/o 1 week of cough, congestion and gradually worsening SOB with wheeze.  Her children and husband have also all been sick.  She has taken Mucinex, Claritin and used her inhaler with minimal relief.  Denies fever, chills, n/v/d.    Past Medical History:  Diagnosis Date  . Anemia   . Asthma     There are no active problems to display for this patient.   Past Surgical History:  Procedure Laterality Date  . CESAREAN SECTION    . CESAREAN SECTION  04/10/2012   Procedure: CESAREAN SECTION;  Surgeon: Marylynn Pearson, MD;  Location: [facility];  Service: Gynecology;  Laterality: N/A;  . WISDOM TOOTH EXTRACTION      OB History    Gravida  6   Para  2   Term  2   Preterm      AB  4   Living  2     SAB  2   TAB  2   Ectopic      Multiple      Live Births  1            Home Medications    Prior to Admission medications   Medication Sig Start Date End Date Taking? Authorizing Provider  lisinopril (PRINIVIL,ZESTRIL) 40 MG tablet Take 40 mg by mouth daily.   Yes [provider]  albuterol (PROVENTIL HFA;VENTOLIN HFA) 108 (90 Base) MCG/ACT inhaler Inhale 1-2 puffs into the lungs every 6 (six) hours as needed for wheezing or shortness of breath. 06/17/18   Noe Gens, PA-C  azithromycin (ZITHROMAX) 250 MG tablet Take 1 tablet (250 mg total) by mouth daily. Take first 2 tablets together, then 1 every day until finished. 06/17/18   Noe Gens, PA-C  benzonatate (TESSALON) 100 MG capsule Take 1-2 capsules (100-200 mg total) by mouth every 8 (eight) hours. 06/17/18   Noe Gens, PA-C  loratadine (CLARITIN) 10 MG tablet Take 10 mg by mouth daily.    [provider]    predniSONE (DELTASONE) 20 MG tablet 3 tabs po day one, then 2 po daily x 4 days 06/17/18   Noe Gens, PA-C  Prenatal Vit-Fe Fumarate-FA (PRENAPLUS) 27-1 MG TABS TAKE 1 TABLET BY MOUTH DAILY 08/21/12   Gale Journey Damaris Hippo, PA-C    Family History History reviewed. No pertinent family history.  Social History Social History   Tobacco Use  . Smoking status: Current Every Day Smoker    Years: 0.00    Types: Cigarettes  . Smokeless tobacco: Never Used  Substance Use Topics  . Alcohol use: No  . Drug use: No     Allergies   Amoxicillin; Keflex [cephalexin]; and Penicillins   Review of Systems Review of Systems  Constitutional: Negative for chills and fever.  HENT: Positive for congestion. Negative for ear pain, sore throat, trouble swallowing and voice change.   Respiratory: Positive for cough, chest tightness and wheezing. Negative for shortness of breath.   Cardiovascular: Negative for chest pain and palpitations.  Gastrointestinal: Negative for abdominal pain, diarrhea, nausea and vomiting.  Musculoskeletal: Negative for arthralgias, back pain and myalgias.  Skin:  Negative for rash.     Physical Exam Triage Vital Signs ED Triage Vitals  Enc Vitals Group     BP 06/17/18 1912 (!) 166/96     Pulse Rate 06/17/18 1912 97     Resp --      Temp 06/17/18 1912 98.2 F (36.8 C)     Temp Source 06/17/18 1912 Oral     SpO2 06/17/18 1912 96 %     Weight 06/17/18 1913 182 lb (82.6 kg)     Height --      Head Circumference --      Peak Flow --      Pain Score 06/17/18 1913 0     Pain Loc --      Pain Edu? --      Excl. in [place]? --    No data found.  Updated Vital Signs BP (!) 166/96 (BP Location: Right Arm)   Pulse 97   Temp 98.2 F (36.8 C) (Oral)   Wt 182 lb (82.6 kg)   LMP 06/09/2018 (Approximate)   SpO2 96%   Breastfeeding? No   BMI 34.39 kg/m   Visual Acuity Right Eye Distance:   Left Eye Distance:   Bilateral Distance:    Right Eye Near:   Left Eye Near:     Bilateral Near:     Physical Exam  Constitutional: She is oriented to person, place, and time. She appears well-developed and well-nourished. No distress.  HENT:  Head: Normocephalic and atraumatic.  Right Ear: Tympanic membrane normal.  Left Ear: Tympanic membrane normal.  Nose: Nose normal. Right sinus exhibits no maxillary sinus tenderness and no frontal sinus tenderness. Left sinus exhibits no maxillary sinus tenderness and no frontal sinus tenderness.  Mouth/Throat: Uvula is midline, oropharynx is clear and moist and mucous membranes are normal.  Eyes: EOM are normal.  Neck: Normal range of motion. Neck supple.  Cardiovascular: Normal rate and regular rhythm.  Pulmonary/Chest: Effort normal. No stridor. No respiratory distress. She has wheezes. She has rhonchi. She has no rales.  Musculoskeletal: Normal range of motion.  Neurological: She is alert and oriented to person, place, and time.  Skin: Skin is warm and dry. She is not diaphoretic.  Psychiatric: She has a normal mood and affect. Her behavior is normal.  Nursing note and vitals reviewed.    UC Treatments / Results  Labs (all labs ordered are listed, but only abnormal results are displayed) Labs Reviewed - No data to display  EKG None  Radiology Dg Chest 2 View  Result Date: 06/17/2018 CLINICAL DATA:  Cough, congestion, SOB X 1 week. Pt current smoker. EXAM: CHEST - 2 VIEW COMPARISON:  none FINDINGS: Lungs are clear. Heart size and mediastinal contours are within normal limits. No effusion. Visualized bones unremarkable. IMPRESSION: No acute cardiopulmonary disease. Electronically Signed   By: Lucrezia Europe M.D.   On: 06/17/2018 19:58    Procedures Procedures (including critical care time)  Medications Ordered in UC Medications  methylPREDNISolone acetate (DEPO-MEDROL) injection 80 mg (80 mg Intramuscular Given 06/17/18 1940)    Initial Impression / Assessment and Plan / UC Course  I have reviewed the triage  vital signs and the nursing notes.  Pertinent labs & imaging results that were available during my care of the patient were reviewed by me and considered in my medical decision making (see chart for details).     Depo-medrol and duoneb given in UC Wheeze improved some but faint wheeze with rhonchi still present on  exam Pt states she feels moderate improvement Discussed CXR with pt Encouraged symptomatic treatment at this time. Prescription to hold with expiration date for azithromycin. Pt to fill if persistent fever develops or not improving in 1 week.   Final Clinical Impressions(s) / UC Diagnoses   Final diagnoses:  Viral URI with cough  Mild intermittent asthma with exacerbation     Discharge Instructions      Your symptoms are likely due to a virus such as the common cold, however, if you developing worsening chest congestion with shortness of breath, persistent fever (>100.4*F) for 3 days, or symptoms not improving in 4-5 days, you may fill the antibiotic (azithromycin).  If you do fill the antibiotic,  please take antibiotics as prescribed and be sure to complete entire course even if you start to feel better to ensure infection does not come back.     ED Prescriptions    Medication Sig Dispense Auth. Provider   predniSONE (DELTASONE) 20 MG tablet 3 tabs po day one, then 2 po daily x 4 days 11 tablet Khole Branch O, PA-C   benzonatate (TESSALON) 100 MG capsule Take 1-2 capsules (100-200 mg total) by mouth every 8 (eight) hours. 21 capsule Gerarda Fraction, Emanual Lamountain O, PA-C   azithromycin (ZITHROMAX) 250 MG tablet Take 1 tablet (250 mg total) by mouth daily. Take first 2 tablets together, then 1 every day until finished. 6 tablet Gerarda Fraction, Dorian Renfro O, PA-C   albuterol (PROVENTIL HFA;VENTOLIN HFA) 108 (90 Base) MCG/ACT inhaler Inhale 1-2 puffs into the lungs every 6 (six) hours as needed for wheezing or shortness of breath. 1 Inhaler Noe Gens, PA-C     Controlled Substance  Prescriptions [place] Controlled Substance Registry consulted? Not Applicable   Tyrell Antonio 06/18/18 3710

## 2018-11-15 ENCOUNTER — Other Ambulatory Visit: Payer: Self-pay

## 2018-11-15 ENCOUNTER — Emergency Department (INDEPENDENT_AMBULATORY_CARE_PROVIDER_SITE_OTHER)
Admission: EM | Admit: 2018-11-15 | Discharge: 2018-11-15 | Disposition: A | Discharge status: Home / Self Care | Payer: 59 | Source: Home / Self Care

## 2018-11-15 ENCOUNTER — Encounter: Payer: Self-pay | Admitting: Emergency Medicine

## 2018-11-15 DIAGNOSIS — R05 Cough: Secondary | ICD-10-CM | POA: Diagnosis not present

## 2018-11-15 DIAGNOSIS — R059 Cough, unspecified: Secondary | ICD-10-CM

## 2018-11-15 LAB — POCT RAPID STREP A (OFFICE): Rapid Strep A Screen: NEGATIVE

## 2018-11-15 MED ORDER — BENZONATATE 100 MG PO CAPS: 100.0000 mg | ORAL_CAPSULE | Freq: Three times a day (TID) | ORAL | 0 refills | Status: DC

## 2018-11-15 MED ORDER — ALBUTEROL SULFATE HFA 108 (90 BASE) MCG/ACT IN AERS: 1.0000 | INHALATION_SPRAY | Freq: Four times a day (QID) | RESPIRATORY_TRACT | 0 refills | Status: DC | PRN

## 2018-11-15 NOTE — Discharge Instructions (Addendum)
Return if any problems.

## 2018-11-15 NOTE — ED Triage Notes (Signed)
Productive cough x 2 weeks, headache, children have Flu B

## 2018-11-16 NOTE — ED Provider Notes (Signed)
[name]    CSN: [id number] Arrival date & time: 11/15/18  1655     History   Chief Complaint Chief Complaint  Patient presents with  . Cough    HPI [name] is a 42 y.o. female.   The history is provided by the patient. No language interpreter was used.  Cough  Cough characteristics:  Productive Sputum characteristics:  Nondescript Severity:  Moderate Onset quality:  Gradual Timing:  Constant Progression:  Worsening Chronicity:  New Smoker: no   Relieved by:  Nothing Worsened by:  Nothing Ineffective treatments:  None tried Associated symptoms: shortness of breath     Past Medical History:  Diagnosis Date  . Anemia   . Asthma     There are no active problems to display for this patient.   Past Surgical History:  Procedure Laterality Date  . CESAREAN SECTION    . CESAREAN SECTION  04/10/2012   Procedure: CESAREAN SECTION;  Surgeon: Marylynn Pearson, MD;  Location: [facility];  Service: Gynecology;  Laterality: N/A;  . WISDOM TOOTH EXTRACTION      OB History    Gravida  6   Para  2   Term  2   Preterm      AB  4   Living  2     SAB  2   TAB  2   Ectopic      Multiple      Live Births  1            Home Medications    Prior to Admission medications   Medication Sig Start Date End Date Taking? Authorizing Provider  albuterol (PROVENTIL HFA;VENTOLIN HFA) 108 (90 Base) MCG/ACT inhaler Inhale 1-2 puffs into the lungs every 6 (six) hours as needed for wheezing or shortness of breath. 11/15/18   Fransico Meadow, PA-C  benzonatate (TESSALON) 100 MG capsule Take 1 capsule (100 mg total) by mouth every 8 (eight) hours. 11/15/18   Fransico Meadow, PA-C  lisinopril (PRINIVIL,ZESTRIL) 40 MG tablet Take 40 mg by mouth daily.    [provider]  loratadine (CLARITIN) 10 MG tablet Take 10 mg by mouth daily.    [provider]  Prenatal Vit-Fe Fumarate-FA (PRENAPLUS) 27-1 MG TABS TAKE 1 TABLET BY MOUTH DAILY  08/21/12   Gale Journey, Damaris Hippo, PA-C    Family History No family history on file.  Social History Social History   Tobacco Use  . Smoking status: Current Every Day Smoker    Years: 0.00    Types: Cigarettes  . Smokeless tobacco: Never Used  Substance Use Topics  . Alcohol use: No  . Drug use: No     Allergies   Amoxicillin; Keflex [cephalexin]; and Penicillins   Review of Systems Review of Systems  Respiratory: Positive for cough and shortness of breath.   All other systems reviewed and are negative.    Physical Exam Triage Vital Signs ED Triage Vitals  Enc Vitals Group     BP 11/15/18 1802 (!) 151/95     Pulse Rate 11/15/18 1802 87     Resp --      Temp 11/15/18 1802 98 F (36.7 C)     Temp Source 11/15/18 1802 Oral     SpO2 11/15/18 1802 96 %     Weight 11/15/18 1804 190 lb (86.2 kg)     Height 11/15/18 1804 5\' 1"  (1.549 m)     Head Circumference --  Peak Flow --      Pain Score 11/15/18 1803 5     Pain Loc --      Pain Edu? --      Excl. in [place]? --    No data found.  Updated Vital Signs BP (!) 151/95 (BP Location: Right Arm)   Pulse 87   Temp 98 F (36.7 C) (Oral)   Ht 5\' 1"  (1.549 m)   Wt 190 lb (86.2 kg)   SpO2 96%   BMI 35.90 kg/m   Visual Acuity Right Eye Distance:   Left Eye Distance:   Bilateral Distance:    Right Eye Near:   Left Eye Near:    Bilateral Near:     Physical Exam Vitals signs and nursing note reviewed.  Constitutional:      Appearance: She is well-developed.  HENT:     Head: Normocephalic.     Right Ear: Tympanic membrane normal.     Left Ear: Tympanic membrane normal.     Nose: Nose normal.     Mouth/Throat:     Mouth: Mucous membranes are moist.  Eyes:     Pupils: Pupils are equal, round, and reactive to light.  Neck:     Musculoskeletal: Normal range of motion.  Cardiovascular:     Rate and Rhythm: Normal rate.  Pulmonary:     Effort: Pulmonary effort is normal.  Abdominal:     General: Abdomen is  flat. There is no distension.  Musculoskeletal: Normal range of motion.  Skin:    General: Skin is warm.  Neurological:     General: No focal deficit present.     Mental Status: She is alert and oriented to person, place, and time.  Psychiatric:        Mood and Affect: Mood normal.      UC Treatments / Results  Labs (all labs ordered are listed, but only abnormal results are displayed) Labs Reviewed  POCT RAPID STREP A (OFFICE)    EKG None  Radiology No results found.  Procedures Procedures (including critical care time)  Medications Ordered in UC Medications - No data to display  Initial Impression / Assessment and Plan / UC Course  I have reviewed the triage vital signs and the nursing notes.  Pertinent labs & imaging results that were available during my care of the patient were reviewed by me and considered in my medical decision making (see chart for details).      Final Clinical Impressions(s) / UC Diagnoses   Final diagnoses:  Cough     Discharge Instructions     Return if any problems.    ED Prescriptions    Medication Sig Dispense Auth. Provider   albuterol (PROVENTIL HFA;VENTOLIN HFA) 108 (90 Base) MCG/ACT inhaler  (Status: Discontinued) Inhale 1-2 puffs into the lungs every 6 (six) hours as needed for wheezing or shortness of breath. 1 Inhaler Sofia, Leslie K, PA-C   benzonatate (TESSALON) 100 MG capsule  (Status: Discontinued) Take 1 capsule (100 mg total) by mouth every 8 (eight) hours. 21 capsule Sofia, Leslie K, PA-C   albuterol (PROVENTIL HFA;VENTOLIN HFA) 108 (90 Base) MCG/ACT inhaler Inhale 1-2 puffs into the lungs every 6 (six) hours as needed for wheezing or shortness of breath. 1 Inhaler Sofia, Leslie K, PA-C   benzonatate (TESSALON) 100 MG capsule Take 1 capsule (100 mg total) by mouth every 8 (eight) hours. 21 capsule Fransico Meadow, [place]     Controlled Substance Prescriptions [place] Controlled Substance  Registry consulted? Not  Applicable   Fransico Meadow, [place] 11/16/18 4949

## 2020-01-06 DIAGNOSIS — R053 Chronic cough: Secondary | ICD-10-CM | POA: Insufficient documentation

## 2020-01-06 DIAGNOSIS — D509 Iron deficiency anemia, unspecified: Secondary | ICD-10-CM | POA: Insufficient documentation

## 2020-05-14 ENCOUNTER — Emergency Department (INDEPENDENT_AMBULATORY_CARE_PROVIDER_SITE_OTHER): Payer: 59

## 2020-05-14 ENCOUNTER — Encounter: Payer: Self-pay | Admitting: Emergency Medicine

## 2020-05-14 ENCOUNTER — Emergency Department (INDEPENDENT_AMBULATORY_CARE_PROVIDER_SITE_OTHER)
Admission: EM | Admit: 2020-05-14 | Discharge: 2020-05-14 | Disposition: A | Discharge status: Home / Self Care | Payer: 59 | Source: Home / Self Care | Attending: Family Medicine | Admitting: Family Medicine

## 2020-05-14 ENCOUNTER — Other Ambulatory Visit: Payer: Self-pay

## 2020-05-14 ENCOUNTER — Emergency Department: Admit: 2020-05-14 | Discharge status: Absent without leave | Payer: Self-pay | Source: Home / Self Care

## 2020-05-14 DIAGNOSIS — J4541 Moderate persistent asthma with (acute) exacerbation: Secondary | ICD-10-CM

## 2020-05-14 DIAGNOSIS — R509 Fever, unspecified: Secondary | ICD-10-CM | POA: Diagnosis not present

## 2020-05-14 DIAGNOSIS — R062 Wheezing: Secondary | ICD-10-CM

## 2020-05-14 DIAGNOSIS — R05 Cough: Secondary | ICD-10-CM | POA: Diagnosis not present

## 2020-05-14 DIAGNOSIS — R0602 Shortness of breath: Secondary | ICD-10-CM

## 2020-05-14 MED ORDER — METHYLPREDNISOLONE SODIUM SUCC 125 MG IJ SOLR
80.0000 mg | Freq: Once | INTRAMUSCULAR | Status: AC
  Administered 2020-05-14: 80 mg via INTRAMUSCULAR

## 2020-05-14 MED ORDER — FLUTICASONE-SALMETEROL 250-50 MCG/DOSE IN AEPB: 1.0000 | INHALATION_SPRAY | Freq: Two times a day (BID) | RESPIRATORY_TRACT | 1 refills | Status: DC

## 2020-05-14 MED ORDER — PREDNISONE 20 MG PO TABS: ORAL_TABLET | ORAL | 0 refills | Status: DC

## 2020-05-14 MED ORDER — ALBUTEROL SULFATE HFA 108 (90 BASE) MCG/ACT IN AERS
1.0000 | INHALATION_SPRAY | Freq: Four times a day (QID) | RESPIRATORY_TRACT | 0 refills | Status: DC | PRN
Start: 2020-05-14 — End: 2023-12-12

## 2020-05-14 MED ORDER — DOXYCYCLINE HYCLATE 100 MG PO CAPS: 100.0000 mg | ORAL_CAPSULE | Freq: Two times a day (BID) | ORAL | 0 refills | Status: DC

## 2020-05-14 NOTE — ED Triage Notes (Signed)
Pt has had cough, cold, SOB w/wheezing (using a nebulizer at home) Pt had a negative rapid covid test on Wed Pt has general malaise  Symptoms have been ongoing for 2 weeks No COVID vaccine

## 2020-05-14 NOTE — ED Provider Notes (Signed)
[name]    CSN: [id number] Arrival date & time: 05/14/20  1705      History   Chief Complaint Chief Complaint  Patient presents with   Shortness of Breath   Cough   Nasal Congestion   Headache    HPI [name] is a 44 y.o. female.   Patient has seasonal rhinitis and asthma.  About 2 weeks ago she developed increased wheezing/shortness of breath improved with her albuterol inhaler.  During the past 4 days she has developed sneezing, increased cough, sore throat, chills, increased sinus drainage, myalgias, fatigue, and headache.  She feels tight in her anterior chest but denies pleuritic pain.  She denies changes in taste/smell. She had a negative rapid COVID19 test 3 days ago.     The history is provided by the patient.    Past Medical History:  Diagnosis Date   Anemia    Asthma     Patient Active Problem List   Diagnosis Date Noted   Chronic cough 01/06/2020   Iron deficiency anemia 01/06/2020   Cigarette nicotine dependence without complication 65/01/5464   Attention deficit hyperactivity disorder (ADHD), combined type 04/07/2015   Allergic rhinitis 01/05/2014   GAD (generalized anxiety disorder) 01/05/2014   HTN (hypertension) 01/05/2014   Asthma 01/05/2009    Past Surgical History:  Procedure Laterality Date   CESAREAN SECTION     CESAREAN SECTION  04/10/2012   Procedure: CESAREAN SECTION;  Surgeon: Marylynn Pearson, MD;  Location: [facility];  Service: Gynecology;  Laterality: N/A;   WISDOM TOOTH EXTRACTION      OB History    Gravida  6   Para  2   Term  2   Preterm      AB  4   Living  2     SAB  2   TAB  2   Ectopic      Multiple      Live Births  1            Home Medications    Prior to Admission medications   Medication Sig Start Date End Date Taking? Authorizing Provider  fluticasone (FLONASE) 50 MCG/ACT nasal spray Place 1 spray into both nostrils daily. 05/04/20  Yes [provider]  hydrochlorothiazide (HYDRODIURIL) 25 MG tablet Take 25 mg by mouth daily. 05/11/20  Yes [provider]  ipratropium-albuterol (DUONEB) 0.5-2.5 (3) MG/3ML SOLN Take 3 mLs by nebulization 3 (three) times daily. 02/24/20  Yes [provider]  loratadine (CLARITIN) 10 MG tablet Take 10 mg by mouth daily.   Yes [provider]  Multiple Vitamin (THERA) TABS Take 1 tablet by mouth daily.   Yes [provider]  omeprazole (PRILOSEC) 40 MG capsule Take 40 mg by mouth daily. 02/24/20  Yes [provider]  Oxymetazoline HCl (NASAL SPRAY) 0.05 % SOLN Place into the nose.   Yes [provider]  verapamil (VERELAN PM) 120 MG 24 hr capsule Take 120 mg by mouth daily. 05/14/20  Yes [provider]  albuterol (VENTOLIN HFA) 108 (90 Base) MCG/ACT inhaler Inhale 1-2 puffs into the lungs every 6 (six) hours as needed for wheezing or shortness of breath. 05/14/20   Kandra Nicolas, MD  benzonatate (TESSALON) 100 MG capsule Take 1 capsule (100 mg total) by mouth every 8 (eight) hours. 11/15/18   Fransico Meadow, PA-C  doxycycline (VIBRAMYCIN) 100 MG capsule Take 1 capsule (100 mg total) by mouth 2 (two) times daily. Take  with food. 05/14/20   Kandra Nicolas, MD  Fluticasone-Salmeterol (ADVAIR) 250-50 MCG/DOSE AEPB Inhale 1 puff into the lungs every 12 (twelve) hours. 05/14/20   Kandra Nicolas, MD  lisinopril (PRINIVIL,ZESTRIL) 40 MG tablet Take 40 mg by mouth daily.    [provider]  predniSONE (DELTASONE) 20 MG tablet Take one tab by mouth twice daily for 4 days, then one daily for 3 days. Take with food. 05/14/20   Kandra Nicolas, MD  Prenatal Vit-Fe Fumarate-FA (PRENAPLUS) 27-1 MG TABS TAKE 1 TABLET BY MOUTH DAILY 08/21/12   Gale Journey, Damaris Hippo, PA-C    Family History Family History  Problem Relation Age of Onset   Hypertension Mother    Hypertension Father     Social History Social History   Tobacco Use   Smoking  status: Current Every Day Smoker    Packs/day: 0.50    Years: 20.00    Pack years: 10.00    Types: Cigarettes   Smokeless tobacco: Never Used  Scientific laboratory technician Use: Some days  Substance Use Topics   Alcohol use: No   Drug use: No     Allergies   Amoxicillin, Keflex [cephalexin], Penicillins, and Lisinopril   Review of Systems Review of Systems + sore throat + cough No pleuritic pain, but feels tight in anterior chest. + wheezing + nasal congestion + post-nasal drainage No sinus pain/pressure No itchy/red eyes No earache No hemoptysis + SOB ? fever, + chills No nausea No vomiting No abdominal pain No diarrhea No urinary symptoms No skin rash + fatigue + myalgias + headache Used OTC meds (Claritin D) without relief   Physical Exam Triage Vital Signs ED Triage Vitals  Enc Vitals Group     BP 05/14/20 1720 (!) 153/96     Pulse Rate 05/14/20 1720 90     Resp 05/14/20 1720 18     Temp 05/14/20 1720 98.5 F (36.9 C)     Temp Source 05/14/20 1720 Oral     SpO2 05/14/20 1720 97 %     Weight --      Height --      Head Circumference --      Peak Flow --      Pain Score 05/14/20 1726 5     Pain Loc --      Pain Edu? --      Excl. in [place]? --    No data found.  Updated Vital Signs BP (!) 153/96 (BP Location: Right Arm)    Pulse 90    Temp 98.5 F (36.9 C) (Oral)    Resp 18    LMP 05/13/2020 (Exact Date)    SpO2 97%   Visual Acuity Right Eye Distance:   Left Eye Distance:   Bilateral Distance:    Right Eye Near:   Left Eye Near:    Bilateral Near:     Physical Exam Nursing notes and Vital Signs reviewed. Appearance:  Patient appears stated age, and in no acute distress Eyes:  Pupils are equal, round, and reactive to light and accomodation.  Extraocular movement is intact.  Conjunctivae are not inflamed  Ears:  Canals normal.  Tympanic membranes normal.  Nose:  Congested turbinates.  No sinus tenderness.  Pharynx:  Normal Neck:  Supple.   Mildly enlarged lateral nodes are present, tender to palpation on the left.   Lungs:  Diffusely scattered wheezes bilaterally.  Breath sounds are equal.  Heart:  Regular rate and rhythm without  murmurs, rubs, or gallops.  Abdomen:  Nontender without masses or hepatosplenomegaly.  Bowel sounds are present.  No CVA or flank tenderness.  Extremities:  No edema.  Skin:  No rash present.   UC Treatments / Results  Labs (all labs ordered are listed, but only abnormal results are displayed) Labs Reviewed  SARS-COV-2 RNA,(COVID-19) QUALITATIVE NAAT    EKG   Radiology DG Chest 2 View  Result Date: 05/14/2020 CLINICAL DATA:  Progressive cough wheezing shortness of breath. Chills. EXAM: CHEST - 2 VIEW COMPARISON:  06/17/2018 FINDINGS: The heart is now at the upper limits of normal in size, increased since 2019. Pulmonary vascularity is normal. Lungs are clear. No effusions. No significant bone abnormality. IMPRESSION: Increase in the cardiac silhouette since the prior study, now at the upper limits of normal. Otherwise negative exam. Electronically Signed   By: Lorriane Shire M.D.   On: 05/14/2020 19:06    Procedures Procedures (including critical care time)  Medications Ordered in UC Medications  methylPREDNISolone sodium succinate (SOLU-MEDROL) 125 mg/2 mL injection 80 mg (80 mg Intramuscular Given 05/14/20 1926)    Initial Impression / Assessment and Plan / UC Course  I have reviewed the triage vital signs and the nursing notes.  Pertinent labs & imaging results that were available during my care of the patient were reviewed by me and considered in my medical decision making (see chart for details).    Administered Solumedrol 80mg  IM; begin prednisone burst/taper tomorrow. COVID19 send out. Begin empiric doxycycline 100mg  BID. Refill albuterol MDI.  Resume Advair. Note increased heart size on chest x-ray; recommend follow-up with PCP. Followup with Family Doctor if not improved in  one week.    Final Clinical Impressions(s) / UC Diagnoses   Final diagnoses:  Moderate persistent asthmatic bronchitis with acute exacerbation     Discharge Instructions     Begin prednisone Saturday 05/15/20. Take plain guaifenesin (1200mg  extended release tabs such as Mucinex) twice daily, with plenty of water, for cough and congestion.  May add Pseudoephedrine (30mg , one or two every 4 to 6 hours) for sinus congestion.  Get adequate rest.   May use Afrin nasal spray (or generic oxymetazoline) each morning for about 5 days and then discontinue.  Also recommend using saline nasal spray several times daily and saline nasal irrigation (AYR is a common brand).  Use Flonase nasal spray each morning after using Afrin nasal spray and saline nasal irrigation. Try warm salt water gargles for sore throat.  Stop all antihistamines (Claritin, etc) for now, and other non-prescription cough/cold preparations. Use albuterol inhaler as needed.  Resume Advair. May take Delsym Cough Suppressant at bedtime for nighttime cough.   If symptoms become significantly worse during the night or over the weekend, proceed to the local emergency room.     ED Prescriptions    Medication Sig Dispense Auth. Provider   albuterol (VENTOLIN HFA) 108 (90 Base) MCG/ACT inhaler Inhale 1-2 puffs into the lungs every 6 (six) hours as needed for wheezing or shortness of breath. 18 g Kandra Nicolas, MD   Fluticasone-Salmeterol (ADVAIR) 250-50 MCG/DOSE AEPB Inhale 1 puff into the lungs every 12 (twelve) hours. 60 each Kandra Nicolas, MD   doxycycline (VIBRAMYCIN) 100 MG capsule Take 1 capsule (100 mg total) by mouth 2 (two) times daily. Take with food. 20 capsule Kandra Nicolas, MD   predniSONE (DELTASONE) 20 MG tablet Take one tab by mouth twice daily for 4 days, then one daily for 3 days. Take  with food. 11 tablet Kandra Nicolas, MD        Kandra Nicolas, MD 05/17/20 [phone]

## 2020-05-14 NOTE — Discharge Instructions (Addendum)
Begin prednisone Saturday 05/15/20. Take plain guaifenesin (1200mg  extended release tabs such as Mucinex) twice daily, with plenty of water, for cough and congestion.  May add Pseudoephedrine (30mg , one or two every 4 to 6 hours) for sinus congestion.  Get adequate rest.   May use Afrin nasal spray (or generic oxymetazoline) each morning for about 5 days and then discontinue.  Also recommend using saline nasal spray several times daily and saline nasal irrigation (AYR is a common brand).  Use Flonase nasal spray each morning after using Afrin nasal spray and saline nasal irrigation. Try warm salt water gargles for sore throat.  Stop all antihistamines (Claritin, etc) for now, and other non-prescription cough/cold preparations. Use albuterol inhaler as needed.  Resume Advair. May take Delsym Cough Suppressant at bedtime for nighttime cough.   If symptoms become significantly worse during the night or over the weekend, proceed to the local emergency room.

## 2020-05-16 LAB — SARS-COV-2 RNA,(COVID-19) QUALITATIVE NAAT: SARS CoV2 RNA: NOT DETECTED

## 2020-10-13 ENCOUNTER — Other Ambulatory Visit: Payer: Self-pay

## 2020-10-13 ENCOUNTER — Emergency Department (INDEPENDENT_AMBULATORY_CARE_PROVIDER_SITE_OTHER)
Admission: EM | Admit: 2020-10-13 | Discharge: 2020-10-13 | Disposition: A | Discharge status: Home / Self Care | Payer: 59 | Source: Home / Self Care | Attending: Family Medicine | Admitting: Family Medicine

## 2020-10-13 DIAGNOSIS — J029 Acute pharyngitis, unspecified: Secondary | ICD-10-CM | POA: Diagnosis not present

## 2020-10-13 DIAGNOSIS — L03221 Cellulitis of neck: Secondary | ICD-10-CM

## 2020-10-13 LAB — POCT RAPID STREP A (OFFICE): Rapid Strep A Screen: NEGATIVE

## 2020-10-13 MED ORDER — DOXYCYCLINE HYCLATE 100 MG PO CAPS: ORAL_CAPSULE | ORAL | 0 refills | Status: AC

## 2020-10-13 NOTE — ED Triage Notes (Signed)
Patient presents to Urgent Care with complaints of sore throat since yesterday. Patient reports when she swallows it hurts, denies fevers.

## 2020-10-13 NOTE — ED Provider Notes (Signed)
[name]    CSN: [id number] Arrival date & time: 10/13/20  1848      History   Chief Complaint Chief Complaint  Patient presents with  . Sore Throat    HPI [name] is a 44 y.o. female.   Yesterday patient developed fatigue, headache, mild sore throat, sinus congestion, and cough.  She also noticed a tender nodule on the right side of her posterior neck two days ago.  The history is provided by the patient.    Past Medical History:  Diagnosis Date  . Anemia   . Asthma     Patient Active Problem List   Diagnosis Date Noted  . Chronic cough 01/06/2020  . Iron deficiency anemia 01/06/2020  . Cigarette nicotine dependence without complication 84/69/6295  . Attention deficit hyperactivity disorder (ADHD), combined type 04/07/2015  . Allergic rhinitis 01/05/2014  . GAD (generalized anxiety disorder) 01/05/2014  . HTN (hypertension) 01/05/2014  . Asthma 01/05/2009    Past Surgical History:  Procedure Laterality Date  . CESAREAN SECTION    . CESAREAN SECTION  04/10/2012   Procedure: CESAREAN SECTION;  Surgeon: Marylynn Pearson, MD;  Location: [facility];  Service: Gynecology;  Laterality: N/A;  . WISDOM TOOTH EXTRACTION      OB History    Gravida  6   Para  2   Term  2   Preterm      AB  4   Living  2     SAB  2   TAB  2   Ectopic      Multiple      Live Births  1            Home Medications    Prior to Admission medications   Medication Sig Start Date End Date Taking? Authorizing Provider  Fluticasone-Umeclidin-Vilant (TRELEGY ELLIPTA) 200-62.5-25 MCG/INH AEPB Inhale into the lungs.   Yes [provider]  albuterol (VENTOLIN HFA) 108 (90 Base) MCG/ACT inhaler Inhale 1-2 puffs into the lungs every 6 (six) hours as needed for wheezing or shortness of breath. 05/14/20   Kandra Nicolas, MD  benzonatate (TESSALON) 100 MG capsule Take 1 capsule (100 mg total) by mouth every 8 (eight) hours. 11/15/18   Fransico Meadow, PA-C  doxycycline (VIBRAMYCIN) 100 MG capsule Take one cap PO Q12hr with food. 10/13/20 10/20/20  Kandra Nicolas, MD  fluticasone (FLONASE) 50 MCG/ACT nasal spray Place 1 spray into both nostrils daily. 05/04/20   [provider]  Fluticasone-Salmeterol (ADVAIR) 250-50 MCG/DOSE AEPB Inhale 1 puff into the lungs every 12 (twelve) hours. 05/14/20   Kandra Nicolas, MD  hydrochlorothiazide (HYDRODIURIL) 25 MG tablet Take 25 mg by mouth daily. 05/11/20   [provider]  ipratropium-albuterol (DUONEB) 0.5-2.5 (3) MG/3ML SOLN Take 3 mLs by nebulization 3 (three) times daily. 02/24/20   [provider]  lisinopril (PRINIVIL,ZESTRIL) 40 MG tablet Take 40 mg by mouth daily.    [provider]  loratadine (CLARITIN) 10 MG tablet Take 10 mg by mouth daily.    [provider]  Multiple Vitamin (THERA) TABS Take 1 tablet by mouth daily.    [provider]  omeprazole (PRILOSEC) 40 MG capsule Take 40 mg by mouth daily. 02/24/20   [provider]  Oxymetazoline HCl (NASAL SPRAY) 0.05 % SOLN Place into the nose.    [provider]  predniSONE (DELTASONE) 20 MG tablet Take one tab by mouth twice daily for 4 days, then one  daily for 3 days. Take with food. 05/14/20   Kandra Nicolas, MD  Prenatal Vit-Fe Fumarate-FA (PRENAPLUS) 27-1 MG TABS TAKE 1 TABLET BY MOUTH DAILY 08/21/12   Gale Journey, Damaris Hippo, PA-C  verapamil (VERELAN PM) 120 MG 24 hr capsule Take 120 mg by mouth daily. 05/14/20   [provider]    Family History Family History  Problem Relation Age of Onset  . Hypertension Mother   . Hypertension Father     Social History Social History   Tobacco Use  . Smoking status: Current Every Day Smoker    Packs/day: 0.50    Years: 20.00    Pack years: 10.00    Types: Cigarettes  . Smokeless tobacco: Never Used  Vaping Use  . Vaping Use: Some days  Substance Use Topics  . Alcohol use: No  . Drug use: No     Allergies     Amoxicillin, Keflex [cephalexin], Penicillins, and Lisinopril   Review of Systems Review of Systems + sore throat + cough No pleuritic pain No wheezing + nasal congestion ? post-nasal drainage No sinus pain/pressure No itchy/red eyes No earache No hemoptysis No SOB No fever/chills No nausea No vomiting No abdominal pain No diarrhea No urinary symptoms No skin rash (+ nodule neck) + fatigue No myalgias + headache    Physical Exam Triage Vital Signs ED Triage Vitals  Enc Vitals Group     BP 10/13/20 1906 (S) (!) 145/110     Pulse Rate 10/13/20 1906 94     Resp 10/13/20 1906 16     Temp 10/13/20 1906 98.9 F (37.2 C)     Temp Source 10/13/20 1906 Oral     SpO2 10/13/20 1906 98 %     Weight --      Height --      Head Circumference --      Peak Flow --      Pain Score 10/13/20 1902 4     Pain Loc --      Pain Edu? --      Excl. in [place]? --    No data found.  Updated Vital Signs BP (S) (!) 145/110 (BP Location: Left Arm) Comment: takes BP meds at night  Pulse 94   Temp 98.9 F (37.2 C) (Oral)   Resp 16   LMP 10/12/2020   SpO2 98%   Visual Acuity Right Eye Distance:   Left Eye Distance:   Bilateral Distance:    Right Eye Near:   Left Eye Near:    Bilateral Near:     Physical Exam Vitals and nursing note reviewed.  Constitutional:      General: She is not in acute distress. HENT:     Head: Normocephalic.     Right Ear: Tympanic membrane and ear canal normal.     Left Ear: Tympanic membrane and ear canal normal.     Nose: Nose normal.     Mouth/Throat:     Mouth: Mucous membranes are moist.  Eyes:     Conjunctiva/sclera: Conjunctivae normal.     Pupils: Pupils are equal, round, and reactive to light.  Neck:      Comments: Erythema and mild swelling base of neck as noted on diagram.  Reactive node palpated posterior neck. Cardiovascular:     Rate and Rhythm: Normal rate.     Heart sounds: Normal heart sounds.  Pulmonary:     Breath  sounds: Normal breath sounds.  Abdominal:     Tenderness:  There is no abdominal tenderness.  Musculoskeletal:     Right lower leg: No edema.     Left lower leg: No edema.  Lymphadenopathy:     Cervical: Cervical adenopathy present.  Skin:    General: Skin is warm and dry.  Neurological:     Mental Status: She is alert and oriented to person, place, and time.      UC Treatments / Results  Labs (all labs ordered are listed, but only abnormal results are displayed) Labs Reviewed  POCT RAPID STREP A (OFFICE) negative    EKG   Radiology No results found.  Procedures Procedures (including critical care time)  Medications Ordered in UC Medications - No data to display  Initial Impression / Assessment and Plan / UC Course  I have reviewed the triage vital signs and the nursing notes.  Pertinent labs & imaging results that were available during my care of the patient were reviewed by me and considered in my medical decision making (see chart for details).    Appears to have a mild cellulitis on posterior neck with a reactive node present.  Begin doxycycline for staph coverage. Treat symptomatically for cold symptoms. Followup with Family Doctor if not improved in one week.    Final Clinical Impressions(s) / UC Diagnoses   Final diagnoses:  Pharyngitis, unspecified etiology  Cellulitis, neck     Discharge Instructions     If increasing cold-like symptoms develop, try the following: Take plain guaifenesin (1200mg  extended release tabs such as Mucinex) twice daily, with plenty of water, for cough and congestion.  Get adequate rest.   May use Afrin nasal spray (or generic oxymetazoline) each morning for about 5 days and then discontinue.  Also recommend using saline nasal spray several times daily and saline nasal irrigation (AYR is a common brand).  Use Flonase nasal spray each morning after using Afrin nasal spray and saline nasal irrigation. Try warm salt water gargles  for sore throat.  Stop all antihistamines for now, and other non-prescription cough/cold preparations. Use inhalers as needed. May take Delsym Cough Suppressant ("12 Hour Cough Relief") at bedtime for nighttime cough.      ED Prescriptions    Medication Sig Dispense Auth. Provider   doxycycline (VIBRAMYCIN) 100 MG capsule Take one cap PO Q12hr with food. 14 capsule Kandra Nicolas, MD        Kandra Nicolas, MD 10/17/20 [phone]

## 2020-10-13 NOTE — Discharge Instructions (Addendum)
If increasing cold-like symptoms develop, try the following: Take plain guaifenesin (1200mg  extended release tabs such as Mucinex) twice daily, with plenty of water, for cough and congestion.  Get adequate rest.   May use Afrin nasal spray (or generic oxymetazoline) each morning for about 5 days and then discontinue.  Also recommend using saline nasal spray several times daily and saline nasal irrigation (AYR is a common brand).  Use Flonase nasal spray each morning after using Afrin nasal spray and saline nasal irrigation. Try warm salt water gargles for sore throat.  Stop all antihistamines for now, and other non-prescription cough/cold preparations. Use inhalers as needed. May take Delsym Cough Suppressant ("12 Hour Cough Relief") at bedtime for nighttime cough.

## 2021-04-22 DIAGNOSIS — N938 Other specified abnormal uterine and vaginal bleeding: Secondary | ICD-10-CM | POA: Insufficient documentation

## 2021-09-27 ENCOUNTER — Other Ambulatory Visit: Payer: Self-pay

## 2021-09-27 ENCOUNTER — Emergency Department (INDEPENDENT_AMBULATORY_CARE_PROVIDER_SITE_OTHER)
Admission: EM | Admit: 2021-09-27 | Discharge: 2021-09-27 | Disposition: A | Discharge status: Home / Self Care | Payer: 59 | Source: Home / Self Care

## 2021-09-27 DIAGNOSIS — R55 Syncope and collapse: Secondary | ICD-10-CM

## 2021-09-27 NOTE — ED Provider Notes (Signed)
[name]    CSN: [id number] Arrival date & time: 09/27/21  1701      History   Chief Complaint Chief Complaint  Patient presents with   Syncope    HPI [name] is a 45 y.o. female.   HPI 45 year old female presents with episode of syncope this morning at 2 AM.  Patient reports taking MiraLAX at 8 PM the night before for constipation.  Additionally patient reports taking care of her son who just had surgery for scoliosis.  Patient reports low blood pressure this morning 96/60.  Patient reports being sleep deprived for 1 week while her son goes through spinal surgery for scoliosis.  Past Medical History:  Diagnosis Date   Anemia    Asthma     Patient Active Problem List   Diagnosis Date Noted   Chronic cough 01/06/2020   Iron deficiency anemia 01/06/2020   Cigarette nicotine dependence without complication 55/73/2202   Attention deficit hyperactivity disorder (ADHD), combined type 04/07/2015   Allergic rhinitis 01/05/2014   GAD (generalized anxiety disorder) 01/05/2014   HTN (hypertension) 01/05/2014   Asthma 01/05/2009    Past Surgical History:  Procedure Laterality Date   CESAREAN SECTION     CESAREAN SECTION  04/10/2012   Procedure: CESAREAN SECTION;  Surgeon: Marylynn Pearson, MD;  Location: [facility];  Service: Gynecology;  Laterality: N/A;   WISDOM TOOTH EXTRACTION      OB History     Gravida  6   Para  2   Term  2   Preterm      AB  4   Living  2      SAB  2   IAB  2   Ectopic      Multiple      Live Births  1            Home Medications    Prior to Admission medications   Medication Sig Start Date End Date Taking? Authorizing Provider  albuterol (VENTOLIN HFA) 108 (90 Base) MCG/ACT inhaler Inhale 1-2 puffs into the lungs every 6 (six) hours as needed for wheezing or shortness of breath. 05/14/20   Kandra Nicolas, MD  fluticasone (FLONASE) 50 MCG/ACT nasal spray Place 1 spray into both nostrils daily. 05/04/20    [provider]  Fluticasone-Umeclidin-Vilant (TRELEGY ELLIPTA) 200-62.5-25 MCG/INH AEPB Inhale into the lungs.    [provider]  hydrochlorothiazide (HYDRODIURIL) 25 MG tablet Take 25 mg by mouth daily. 05/11/20   [provider]  ipratropium-albuterol (DUONEB) 0.5-2.5 (3) MG/3ML SOLN Take 3 mLs by nebulization 3 (three) times daily. 02/24/20   [provider]  loratadine (CLARITIN) 10 MG tablet Take 10 mg by mouth daily.    [provider]  Multiple Vitamin (THERA) TABS Take 1 tablet by mouth daily.    [provider]  omeprazole (PRILOSEC) 40 MG capsule Take 40 mg by mouth daily. 02/24/20   [provider]  Oxymetazoline HCl (NASAL SPRAY) 0.05 % SOLN Place into the nose.    [provider]  verapamil (VERELAN PM) 120 MG 24 hr capsule Take 120 mg by mouth daily. 05/14/20   [provider]  Fluticasone-Salmeterol (ADVAIR) 250-50 MCG/DOSE AEPB Inhale 1 puff into the lungs every 12 (twelve) hours. 05/14/20 10/17/20  Kandra Nicolas, MD  lisinopril (PRINIVIL,ZESTRIL) 40 MG tablet Take 40 mg by mouth daily.  10/17/20  [provider]    Family History Family History  Problem Relation Age of Onset  Hypertension Mother    Hypertension Father     Social History Social History   Tobacco Use   Smoking status: Every Day    Packs/day: 0.50    Years: 20.00    Pack years: 10.00    Types: Cigarettes   Smokeless tobacco: Never  Vaping Use   Vaping Use: Some days  Substance Use Topics   Alcohol use: No   Drug use: No     Allergies   Amoxicillin, Keflex [cephalexin], Penicillins, and Lisinopril   Review of Systems Review of Systems  Neurological:  Positive for dizziness.  All other systems reviewed and are negative.   Physical Exam Triage Vital Signs ED Triage Vitals  Enc Vitals Group     BP 09/27/21 1721 (!) 146/87     Pulse Rate 09/27/21 1721 83     Resp 09/27/21 1721 17     Temp 09/27/21  1721 98.6 F (37 C)     Temp Source 09/27/21 1721 Oral     SpO2 09/27/21 1721 100 %     Weight --      Height --      Head Circumference --      Peak Flow --      Pain Score 09/27/21 1716 0     Pain Loc --      Pain Edu? --      Excl. in [place]? --    Orthostatic VS for the past 24 hrs:  BP- Lying Pulse- Lying BP- Sitting Pulse- Sitting BP- Standing at 0 minutes Pulse- Standing at 0 minutes  09/27/21 1725 146/87 83 142/90 87 (!) 122/94 95    Updated Vital Signs BP (!) 146/87 (BP Location: Left Arm)   Pulse 83   Temp 98.6 F (37 C) (Oral)   Resp 17   SpO2 100%      Physical Exam Vitals and nursing note reviewed.  Constitutional:      Appearance: Normal appearance. She is obese.  HENT:     Head: Normocephalic and atraumatic.     Right Ear: Tympanic membrane, ear canal and external ear normal.     Left Ear: Tympanic membrane, ear canal and external ear normal.     Nose: Nose normal.     Mouth/Throat:     Mouth: Mucous membranes are moist.     Pharynx: Oropharynx is clear.  Eyes:     Extraocular Movements: Extraocular movements intact.     Conjunctiva/sclera: Conjunctivae normal.     Pupils: Pupils are equal, round, and reactive to light.  Cardiovascular:     Rate and Rhythm: Normal rate and regular rhythm.     Pulses: Normal pulses.     Heart sounds: Normal heart sounds.  Pulmonary:     Effort: Pulmonary effort is normal.     Breath sounds: Normal breath sounds.  Musculoskeletal:        General: Normal range of motion.     Cervical back: Normal range of motion and neck supple.  Skin:    General: Skin is warm and dry.  Neurological:     General: No focal deficit present.     Mental Status: She is alert and oriented to person, place, and time.     UC Treatments / Results  Labs (all labs ordered are listed, but only abnormal results are displayed) Labs Reviewed  COMPLETE METABOLIC PANEL WITH GFR  CBC WITH DIFFERENTIAL/PLATELET    EKG   Radiology No  results found.  Procedures Procedures (including  critical care time)  Medications Ordered in UC Medications - No data to display  Initial Impression / Assessment and Plan / UC Course  I have reviewed the triage vital signs and the nursing notes.  Pertinent labs & imaging results that were available during my care of the patient were reviewed by me and considered in my medical decision making (see chart for details).     MDM: 1.  Syncope, unspecified syncope type.  EKG revealed NSR with possible left atrial enlargement borderline EKG. advised patient we will follow-up with blood work once received. Advised patient we will follow-up with lab results once received.  Advised patient if syncope worsens and/or unresolved please go to nearest ED for immediate evaluation.  Advised/encouraged patient to work on nightly sleep hygiene to regain sleep hours lost.  Encouraged patient to take daily blood pressure in the morning before breakfast and to log measurements so that new PCP may better evaluate daily blood pressure trends.  Patient discharged home, hemodynamically stable. Final Clinical Impressions(s) / UC Diagnoses   Final diagnoses:  Syncope, unspecified syncope type     Discharge Instructions      Advised patient we will follow-up with lab results once received.  Advised patient if syncope worsens and/or unresolved please go to nearest ED for immediate evaluation.  Advised/encouraged patient to work on nightly sleep hygiene to regain sleep hours lost.  Encouraged patient to take daily blood pressure in the morning before breakfast and to log measurements so that new PCP may better evaluate daily blood pressure trends.     ED Prescriptions   None    PDMP not reviewed this encounter.   Eliezer Lofts, FNP 09/27/21 1806

## 2021-09-27 NOTE — ED Triage Notes (Signed)
Pt had an episode of syncope this morning at 2am. Miralax at 8pm for constipation. Say she  was getting up to care for son who just had surgery for scoliosis when she passed out after feeling nauseous. Woke up feeling nauseous and immediately when to the bathroom. Husband took BP about 10 mins after episode, was 96/60. Takes BP meds daily but has not taken today. Has lost 60 lbs since April due to dieting.

## 2021-09-27 NOTE — Discharge Instructions (Addendum)
Advised patient we will follow-up with lab results once received.  Advised patient if syncope worsens and/or unresolved please go to nearest ED for immediate evaluation.  Advised/encouraged patient to work on nightly sleep hygiene to regain sleep hours lost.  Encouraged patient to take daily blood pressure in the morning before breakfast and to log measurements so that new PCP may better evaluate daily blood pressure trends.

## 2021-09-28 LAB — COMPLETE METABOLIC PANEL WITH GFR
AG Ratio: 1.4 (calc) (ref 1.0–2.5)
ALT: 11 U/L (ref 6–29)
AST: 12 U/L (ref 10–30)
Albumin: 3.9 g/dL (ref 3.6–5.1)
Alkaline phosphatase (APISO): 63 U/L (ref 31–125)
BUN: 14 mg/dL (ref 7–25)
CO2: 25 mmol/L (ref 20–32)
Calcium: 9.3 mg/dL (ref 8.6–10.2)
Chloride: 104 mmol/L (ref 98–110)
Creat: 0.68 mg/dL (ref 0.50–0.99)
Globulin: 2.7 g/dL (calc) (ref 1.9–3.7)
Glucose, Bld: 99 mg/dL (ref 65–99)
Potassium: 3.8 mmol/L (ref 3.5–5.3)
Sodium: 139 mmol/L (ref 135–146)
Total Bilirubin: 0.3 mg/dL (ref 0.2–1.2)
Total Protein: 6.6 g/dL (ref 6.1–8.1)
eGFR: 111 mL/min/{1.73_m2} (ref >=60)

## 2021-09-28 LAB — CBC WITH DIFFERENTIAL/PLATELET
Absolute Monocytes: 679 cells/uL (ref 200–950)
Basophils Absolute: 26 cells/uL (ref 0–200)
Basophils Relative: 0.3 %
Eosinophils Absolute: 400 cells/uL (ref 15–500)
Eosinophils Relative: 4.6 %
HCT: 36.4 % (ref 35.0–45.0)
Hemoglobin: 11.7 g/dL (ref 11.7–15.5)
Lymphs Abs: 2332 cells/uL (ref 850–3900)
MCH: 26.3 pg — ABNORMAL LOW (ref 27.0–33.0)
MCHC: 32.1 g/dL (ref 32.0–36.0)
MCV: 81.8 fL (ref 80.0–100.0)
MPV: 10.2 fL (ref 7.5–12.5)
Monocytes Relative: 7.8 %
Neutro Abs: 5264 cells/uL (ref 1500–7800)
Neutrophils Relative %: 60.5 %
Platelets: 412 10*3/uL — ABNORMAL HIGH (ref 140–400)
RBC: 4.45 10*6/uL (ref 3.80–5.10)
RDW: 16 % — ABNORMAL HIGH (ref 11.0–15.0)
Total Lymphocyte: 26.8 %
WBC: 8.7 10*3/uL (ref 3.8–10.8)

## 2022-01-27 ENCOUNTER — Other Ambulatory Visit: Payer: Self-pay

## 2022-01-27 ENCOUNTER — Ambulatory Visit (INDEPENDENT_AMBULATORY_CARE_PROVIDER_SITE_OTHER): Payer: 59 | Admitting: Medical-Surgical

## 2022-01-27 ENCOUNTER — Encounter: Payer: Self-pay | Admitting: Medical-Surgical

## 2022-01-27 VITALS — BP 129/80 | HR 78 | Temp 98.8°F | Ht 61.0 in | Wt 152.0 lb

## 2022-01-27 DIAGNOSIS — I10 Essential (primary) hypertension: Secondary | ICD-10-CM | POA: Diagnosis not present

## 2022-01-27 DIAGNOSIS — D509 Iron deficiency anemia, unspecified: Secondary | ICD-10-CM | POA: Diagnosis not present

## 2022-01-27 DIAGNOSIS — Z1329 Encounter for screening for other suspected endocrine disorder: Secondary | ICD-10-CM

## 2022-01-27 DIAGNOSIS — Z131 Encounter for screening for diabetes mellitus: Secondary | ICD-10-CM | POA: Diagnosis not present

## 2022-01-27 DIAGNOSIS — Z8639 Personal history of other endocrine, nutritional and metabolic disease: Secondary | ICD-10-CM

## 2022-01-27 DIAGNOSIS — Z01419 Encounter for gynecological examination (general) (routine) without abnormal findings: Secondary | ICD-10-CM

## 2022-01-27 DIAGNOSIS — Z1231 Encounter for screening mammogram for malignant neoplasm of breast: Secondary | ICD-10-CM

## 2022-01-27 NOTE — Progress Notes (Signed)
? ?New Patient Office Visit ? ?Subjective:  ?Patient ID: [name], female    DOB: 05-25-1976  Age: 46 y.o. MRN: [id number] ? ?CC:  ?Chief Complaint  ?Patient presents with  ? New Patient (Initial Visit)  ? ? ? ?HPI ?[name] presents to establish care.  She is a very pleasant 46 year old female who is here with the following concerns: ? ?Hypertension-currently taking verapamil 120 mg twice daily and her hydrochlorothiazide 25 mg daily.  Tolerating well without side effects.  Does not routinely check her blood pressure at home.  No chest pain, shortness of breath, dizziness, lower extremity edema, or palpitations.  She does have a history of syncopal episodes as below. ? ?Syncope-in 2019, she had a severe coughing fit and ended up passing out.  She was sitting down at the time and did not have any position changes to explain it.  This happened again 6 months later when she was laughing very hard and also passed out.  Most recently, her son had spinal surgery and she had to take care of him at the hospital and for his recovery at home.  She had poor self-care for about a week and was not eating, drinking, or sleeping well.  She notes that she got up to get his drink and medicine and ended up passing out after feeling lightheaded and nauseous.  She woke up on the floor and had difficulty standing so she crawled to her husband had no syncopal episodes since then. ? ?Headaches-has been having daily headaches for the last month or so.  These affect her across her forehead and the top of her head.  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  No dizziness, nausea, near syncope, vision changes, photophobia/phonophobia, or tinnitus associated with her headaches. ? ?Past Medical History:  ?Diagnosis Date  ? Allergy   ? Anemia   ? Asthma   ? Depression   ? Hyperlipidemia   ? Hypertension   ? ? ?Past Surgical History:  ?Procedure Laterality Date  ? CESAREAN SECTION    ?  CESAREAN SECTION  04/10/2012  ? Procedure: CESAREAN SECTION;  Surgeon: Marylynn Pearson, MD;  Location: [facility];  Service: Gynecology;  Laterality: N/A;  ? WISDOM TOOTH EXTRACTION    ? ? ?Family History  ?Problem Relation Age of Onset  ? Hypertension Mother   ? Hypertension Father   ? Hypertension Maternal Grandmother   ? ? ?Social History  ? ?Socioeconomic History  ? Marital status: Married  ?  Spouse name: Not on file  ? Number of children: 2  ? Years of education: Not on file  ? Highest education level: Not on file  ?Occupational History  ? Not on file  ?Tobacco Use  ? Smoking status: Every Day  ?  Packs/day: 0.50  ?  Years: 20.00  ?  Pack years: 10.00  ?  Types: Cigarettes  ? Smokeless tobacco: Never  ?Vaping Use  ? Vaping Use: Some days  ?Substance and Sexual Activity  ? Alcohol use: No  ? Drug use: No  ? Sexual activity: Yes  ?Other Topics Concern  ? Not on file  ?Social History Narrative  ? Not on file  ? ?Social Determinants of Health  ? ?Financial Resource Strain: Not on file  ?Food Insecurity: Not on file  ?Transportation Needs: Not on file  ?Physical Activity: Not on file  ?Stress: Not on file  ?Social Connections: Not on file  ?  Intimate Partner Violence: Not on file  ? ? ?ROS ?Review of Systems  ?Constitutional:  Negative for chills, fatigue, fever and unexpected weight change.  ?HENT:  Negative for congestion, rhinorrhea, sinus pressure and sore throat.   ?Respiratory:  Negative for cough, chest tightness and shortness of breath.   ?Cardiovascular:  Negative for chest pain, palpitations and leg swelling.  ?Gastrointestinal:  Negative for abdominal pain, constipation, diarrhea, nausea and vomiting.  ?Endocrine: Negative for cold intolerance and heat intolerance.  ?Genitourinary:  Negative for dysuria, frequency, urgency, vaginal bleeding and vaginal discharge.  ?Skin:  Negative for rash and wound.  ?Neurological:  Positive for headaches. Negative for dizziness and light-headedness.  ?Hematological:  Does  not bruise/bleed easily.  ?Psychiatric/Behavioral:  Negative for dysphoric mood, self-injury, sleep disturbance and suicidal ideas. The patient is not nervous/anxious.   ? ?Objective:  ? ?Today's Vitals: BP 129/80   Pulse 78   Temp 98.8 ?F (37.1 ?C)   Ht 5\' 1"  (1.549 m)   Wt 152 lb (68.9 kg)   LMP 01/19/2022   SpO2 98%   BMI 28.72 kg/m?  ? ?Physical Exam ?Vitals reviewed.  ?Constitutional:   ?   General: She is not in acute distress. ?   Appearance: Normal appearance. She is not ill-appearing.  ?HENT:  ?   Head: Normocephalic and atraumatic.  ?Cardiovascular:  ?   Rate and Rhythm: Normal rate and regular rhythm.  ?   Pulses: Normal pulses.  ?   Heart sounds: Normal heart sounds. No murmur heard. ?  No friction rub. No gallop.  ?Pulmonary:  ?   Effort: Pulmonary effort is normal. No respiratory distress.  ?   Breath sounds: Normal breath sounds. No wheezing.  ?Skin: ?   General: Skin is warm and dry.  ?Neurological:  ?   Mental Status: She is alert and oriented to person, place, and time.  ?Psychiatric:     ?   Mood and Affect: Mood normal.     ?   Behavior: Behavior normal.     ?   Thought Content: Thought content normal.     ?   Judgment: Judgment normal.  ? ? ?Assessment & Plan:  ? ?1. Primary hypertension ?Checking labs as below.  Continue verapamil and hydrochlorothiazide as prescribed.  Blood pressure well controlled.  Continue low-sodium diet. ?- Lipid Panel w/reflex Direct LDL ?- CBC ?- COMPLETE METABOLIC PANEL WITH GFR ? ?2. Iron deficiency anemia, unspecified iron deficiency anemia type ?Checking iron panel today. ?- Fe+TIBC+Fer ? ?3. Diabetes mellitus screening ?Checking hemoglobin A1c. ?- Hemoglobin A1c ? ?4. Thyroid disorder screen ?Checking TSH. ?- TSH ? ?5. Well woman exam ?Referring to OB/GYN per patient request. ?- Ambulatory referral to Obstetrics / Gynecology ? ?6. History of vitamin D deficiency ?Checking vitamin D. ?- VITAMIN D 25 Hydroxy (Vit-D Deficiency, Fractures) ? ?7.  Breast cancer  screening ?Mammogram ordered. ? ?Outpatient Encounter Medications as of 01/27/2022  ?Medication Sig  ? albuterol (VENTOLIN HFA) 108 (90 Base) MCG/ACT inhaler Inhale 1-2 puffs into the lungs every 6 (six) hours as needed for wheezing or shortness of breath.  ? fluticasone (FLONASE) 50 MCG/ACT nasal spray Place 1 spray into both nostrils daily.  ? hydrochlorothiazide (HYDRODIURIL) 25 MG tablet Take 25 mg by mouth daily.  ? ipratropium-albuterol (DUONEB) 0.5-2.5 (3) MG/3ML SOLN Take 3 mLs by nebulization 3 (three) times daily.  ? loratadine (CLARITIN) 10 MG tablet Take 10 mg by mouth daily.  ? Multiple Vitamin (THERA) TABS Take 1 tablet  by mouth daily.  ? omeprazole (PRILOSEC) 40 MG capsule Take 40 mg by mouth daily.  ? verapamil (VERELAN PM) 120 MG 24 hr capsule Take 120 mg by mouth daily.  ? [DISCONTINUED] Fluticasone-Salmeterol (ADVAIR) 250-50 MCG/DOSE AEPB Inhale 1 puff into the lungs every 12 (twelve) hours.  ? [DISCONTINUED] Fluticasone-Umeclidin-Vilant (TRELEGY ELLIPTA) 200-62.5-25 MCG/INH AEPB Inhale into the lungs.  ? [DISCONTINUED] lisinopril (PRINIVIL,ZESTRIL) 40 MG tablet Take 40 mg by mouth daily.  ? [DISCONTINUED] Oxymetazoline HCl (NASAL SPRAY) 0.05 % SOLN Place into the nose.  ? ?No facility-administered encounter medications on file as of 01/27/2022.  ? ? ?Follow-up: Return in about 6 months (around 07/30/2022) for HTN follow up.  ? ?Clearnce Sorrel, DNP, APRN, FNP-BC ?[facility] ? ?

## 2022-01-28 LAB — CBC
HCT: 35.4 % (ref 35.0–45.0)
Hemoglobin: 10.9 g/dL — ABNORMAL LOW (ref 11.7–15.5)
MCH: 25.4 pg — ABNORMAL LOW (ref 27.0–33.0)
MCHC: 30.8 g/dL — ABNORMAL LOW (ref 32.0–36.0)
MCV: 82.5 fL (ref 80.0–100.0)
MPV: 9.5 fL (ref 7.5–12.5)
Platelets: 370 10*3/uL (ref 140–400)
RBC: 4.29 10*6/uL (ref 3.80–5.10)
RDW: 14.6 % (ref 11.0–15.0)
WBC: 6.5 10*3/uL (ref 3.8–10.8)

## 2022-01-28 LAB — COMPLETE METABOLIC PANEL WITH GFR
AG Ratio: 1.8 (calc) (ref 1.0–2.5)
ALT: 12 U/L (ref 6–29)
AST: 15 U/L (ref 10–30)
Albumin: 4.6 g/dL (ref 3.6–5.1)
Alkaline phosphatase (APISO): 47 U/L (ref 31–125)
BUN: 14 mg/dL (ref 7–25)
CO2: 24 mmol/L (ref 20–32)
Calcium: 10 mg/dL (ref 8.6–10.2)
Chloride: 100 mmol/L (ref 98–110)
Creat: 0.59 mg/dL (ref 0.50–0.99)
Globulin: 2.6 g/dL (calc) (ref 1.9–3.7)
Glucose, Bld: 87 mg/dL (ref 65–99)
Potassium: 4.1 mmol/L (ref 3.5–5.3)
Sodium: 136 mmol/L (ref 135–146)
Total Bilirubin: 0.4 mg/dL (ref 0.2–1.2)
Total Protein: 7.2 g/dL (ref 6.1–8.1)
eGFR: 115 mL/min/{1.73_m2} (ref >=60)

## 2022-01-28 LAB — IRON,TIBC AND FERRITIN PANEL
%SAT: 10 % (calc) — ABNORMAL LOW (ref 16–45)
Ferritin: 2 ng/mL — ABNORMAL LOW (ref 16–232)
Iron: 48 ug/dL (ref 40–190)
TIBC: 493 mcg/dL (calc) — ABNORMAL HIGH (ref 250–450)

## 2022-01-28 LAB — VITAMIN D 25 HYDROXY (VIT D DEFICIENCY, FRACTURES): Vit D, 25-Hydroxy: 43 ng/mL (ref 30–100)

## 2022-01-28 LAB — LIPID PANEL W/REFLEX DIRECT LDL
Cholesterol: 198 mg/dL (ref <200)
HDL: 84 mg/dL (ref >=50)
LDL Cholesterol (Calc): 99 mg/dL (calc)
Non-HDL Cholesterol (Calc): 114 mg/dL (calc) (ref <130)
Total CHOL/HDL Ratio: 2.4 (calc) (ref <5.0)
Triglycerides: 68 mg/dL (ref <150)

## 2022-01-28 LAB — HEMOGLOBIN A1C
Hgb A1c MFr Bld: 5.1 % of total Hgb (ref <5.7)
Mean Plasma Glucose: 100 mg/dL
eAG (mmol/L): 5.5 mmol/L

## 2022-01-28 LAB — TSH: TSH: 1.82 mIU/L

## 2022-04-19 ENCOUNTER — Other Ambulatory Visit: Payer: Self-pay | Admitting: Medical-Surgical

## 2022-04-19 MED ORDER — VERAPAMIL HCL ER 120 MG PO CP24: 120.0000 mg | ORAL_CAPSULE | Freq: Every day | ORAL | 1 refills | Status: DC

## 2022-04-19 MED ORDER — HYDROCHLOROTHIAZIDE 25 MG PO TABS: 25.0000 mg | ORAL_TABLET | Freq: Every day | ORAL | 1 refills | Status: DC

## 2022-04-19 NOTE — Telephone Encounter (Signed)
Please verify the frequency of the Verapamil. Per her visit with her previous PCP in February, she was taking it once daily at bedtime. Per the note from March, it was reported that she was taking it twice daily.

## 2022-04-27 ENCOUNTER — Ambulatory Visit: Payer: 59 | Admitting: Medical-Surgical

## 2022-05-03 ENCOUNTER — Telehealth: Payer: Self-pay | Admitting: *Deleted

## 2022-05-03 NOTE — Telephone Encounter (Signed)
Left patient a message to call the office about scheduled appointment on 07/24/2022. Patient needs to be given office and appointment information and the office needs to ask her questions about prior GYN medical records.

## 2022-05-11 ENCOUNTER — Ambulatory Visit (INDEPENDENT_AMBULATORY_CARE_PROVIDER_SITE_OTHER): Payer: 59

## 2022-05-11 DIAGNOSIS — Z1231 Encounter for screening mammogram for malignant neoplasm of breast: Secondary | ICD-10-CM | POA: Diagnosis not present

## 2022-05-17 ENCOUNTER — Other Ambulatory Visit: Payer: Self-pay | Admitting: Medical-Surgical

## 2022-05-17 DIAGNOSIS — R928 Other abnormal and inconclusive findings on diagnostic imaging of breast: Secondary | ICD-10-CM

## 2022-05-25 ENCOUNTER — Ambulatory Visit
Admission: RE | Admit: 2022-05-25 | Discharge: 2022-05-25 | Disposition: A | Discharge status: Home / Self Care | Payer: 59 | Source: Ambulatory Visit | Attending: Medical-Surgical | Admitting: Medical-Surgical

## 2022-05-25 ENCOUNTER — Other Ambulatory Visit: Payer: Self-pay | Admitting: Medical-Surgical

## 2022-05-25 DIAGNOSIS — N6320 Unspecified lump in the left breast, unspecified quadrant: Secondary | ICD-10-CM

## 2022-05-25 DIAGNOSIS — R928 Other abnormal and inconclusive findings on diagnostic imaging of breast: Secondary | ICD-10-CM

## 2022-05-28 NOTE — Progress Notes (Signed)
Established Patient Office Visit  Subjective   Patient ID: [name], female   DOB: 04-30-1976 Age: 46 y.o. MRN: [id number]   Chief Complaint  Patient presents with   Follow-up    HPI Pleasant 46 year old female presenting today for follow up on IDA.   Stress/anxiety: has been higher in stress and anxiety lately. Has been stress eating and has gained a little weight. This causes her to be down on herself. Work has also been stressful since they are now monitoring clicks and if more than 1 minute idle, it counts against them. Getting frustrated and angry about the micromanaging.   ADHD: off meds for about three year, cost prohibitive due to frequent specialists appoint. Interested in restarting.  Having difficulty getting tasks completed and notes that her house is almost always a mess and she cannot seem to get anything done in a timely manner.  Previously treated with Adderall instant release twice daily but is unsure of the dose, thinks it was 20 mg.  Insomnia: Has difficulty getting to sleep at night.  When she lays down, she has issues with her mind racing.  Once she is asleep, she is usually sleeps pretty well.  Would like to try a prescription medicine for this.  Has been using Unisom and melatonin which do help some but can leave her groggy at times.  Iron deficiency anemia: Would like to have her labs rechecked as she has been taking a daily oral iron supplement for approximately 12 weeks.    Objective:    Vitals:   05/29/22 1533  BP: (!) 145/92  Pulse: 72  Resp: 20  Height: '5\' 1"'$  (1.549 m)  Weight: 159 lb 9.6 oz (72.4 kg)  SpO2: 99%  BMI (Calculated): 30.17   Physical Exam Vitals and nursing note reviewed.  Constitutional:      General: She is not in acute distress.    Appearance: Normal appearance. She is not ill-appearing.  HENT:     Head: Normocephalic and atraumatic.  Cardiovascular:     Rate and Rhythm: Normal rate and regular rhythm.     Pulses:  Normal pulses.     Heart sounds: Normal heart sounds.  Pulmonary:     Effort: Pulmonary effort is normal. No respiratory distress.     Breath sounds: Normal breath sounds. No wheezing, rhonchi or rales.  Skin:    General: Skin is warm and dry.  Neurological:     Mental Status: She is alert and oriented to person, place, and time.  Psychiatric:        Mood and Affect: Mood normal. Affect is tearful.        Behavior: Behavior normal.        Thought Content: Thought content normal.        Judgment: Judgment normal.   No results found for this or any previous visit (from the past 24 hour(s)).     The 10-year ASCVD risk score (Arnett DK, et al., 2019) is: 2.1%   Values used to calculate the score:     Age: 28 years     Sex: Female     Is Non-Hispanic African American: No     Diabetic: No     Tobacco smoker: Yes     Systolic Blood Pressure: 038 mmHg     Is BP treated: Yes     HDL Cholesterol: 84 mg/dL     Total Cholesterol: 198 mg/dL   Assessment & Plan:   1. Iron deficiency  anemia secondary to inadequate dietary iron intake Checking CBC with differential and iron panel today.  Continue daily oral iron supplementation. - CBC with Differential/Platelet - Fe+TIBC+Fer  2. Attention deficit hyperactivity disorder (ADHD), combined type Restarting Adderall 20 mg IR twice daily.  Advised to start at 10 mg twice daily for the first week then increase to 20 mg twice daily if this is not helpful at the lower dose.  3. GAD (generalized anxiety disorder) Starting fluoxetine 10 mg daily for for 1 week then increase to 20 mg daily.  4. Psychophysiological insomnia Start hydroxyzine 25-50 mg nightly as needed for sleep.  Return in about 6 weeks (around 07/10/2022) for mood follow up.  ___________________________________________ Clearnce Sorrel, DNP, APRN, FNP-BC [facility] [place]

## 2022-05-29 ENCOUNTER — Encounter: Payer: Self-pay | Admitting: Medical-Surgical

## 2022-05-29 ENCOUNTER — Ambulatory Visit (INDEPENDENT_AMBULATORY_CARE_PROVIDER_SITE_OTHER): Payer: 59 | Admitting: Medical-Surgical

## 2022-05-29 VITALS — BP 145/92 | HR 72 | Resp 20 | Ht 61.0 in | Wt 159.6 lb

## 2022-05-29 DIAGNOSIS — F411 Generalized anxiety disorder: Secondary | ICD-10-CM | POA: Diagnosis not present

## 2022-05-29 DIAGNOSIS — F902 Attention-deficit hyperactivity disorder, combined type: Secondary | ICD-10-CM | POA: Diagnosis not present

## 2022-05-29 DIAGNOSIS — F5104 Psychophysiologic insomnia: Secondary | ICD-10-CM

## 2022-05-29 DIAGNOSIS — D508 Other iron deficiency anemias: Secondary | ICD-10-CM

## 2022-05-29 MED ORDER — FLUOXETINE HCL 20 MG PO CAPS: 20.0000 mg | ORAL_CAPSULE | Freq: Every day | ORAL | 3 refills | Status: DC

## 2022-05-29 MED ORDER — AMPHETAMINE-DEXTROAMPHETAMINE 20 MG PO TABS: 20.0000 mg | ORAL_TABLET | Freq: Two times a day (BID) | ORAL | 0 refills | Status: DC

## 2022-05-29 MED ORDER — HYDROXYZINE PAMOATE 25 MG PO CAPS: 25.0000 mg | ORAL_CAPSULE | Freq: Every evening | ORAL | 0 refills | Status: DC | PRN

## 2022-05-29 MED ORDER — FLUOXETINE HCL 10 MG PO CAPS: 10.0000 mg | ORAL_CAPSULE | Freq: Every day | ORAL | 0 refills | Status: DC

## 2022-05-30 LAB — CBC WITH DIFFERENTIAL/PLATELET
Absolute Monocytes: 763 cells/uL (ref 200–950)
Basophils Absolute: 43 cells/uL (ref 0–200)
Basophils Relative: 0.6 %
Eosinophils Absolute: 310 cells/uL (ref 15–500)
Eosinophils Relative: 4.3 %
HCT: 40.9 % (ref 35.0–45.0)
Hemoglobin: 13.3 g/dL (ref 11.7–15.5)
Lymphs Abs: 2549 cells/uL (ref 850–3900)
MCH: 29.3 pg (ref 27.0–33.0)
MCHC: 32.5 g/dL (ref 32.0–36.0)
MCV: 90.1 fL (ref 80.0–100.0)
MPV: 9.5 fL (ref 7.5–12.5)
Monocytes Relative: 10.6 %
Neutro Abs: 3535 cells/uL (ref 1500–7800)
Neutrophils Relative %: 49.1 %
Platelets: 338 10*3/uL (ref 140–400)
RBC: 4.54 10*6/uL (ref 3.80–5.10)
RDW: 14.7 % (ref 11.0–15.0)
Total Lymphocyte: 35.4 %
WBC: 7.2 10*3/uL (ref 3.8–10.8)

## 2022-05-30 LAB — IRON,TIBC AND FERRITIN PANEL
%SAT: 9 % (calc) — ABNORMAL LOW (ref 16–45)
Ferritin: 9 ng/mL — ABNORMAL LOW (ref 16–232)
Iron: 37 ug/dL — ABNORMAL LOW (ref 40–190)
TIBC: 421 mcg/dL (calc) (ref 250–450)

## 2022-05-31 NOTE — Addendum Note (Signed)
Addended bySamuel Bouche on: 05/31/2022 12:08 PM   Modules accepted: Orders

## 2022-06-19 ENCOUNTER — Encounter: Payer: Self-pay | Admitting: Medical-Surgical

## 2022-06-20 ENCOUNTER — Other Ambulatory Visit: Payer: Self-pay | Admitting: Family

## 2022-06-20 DIAGNOSIS — D508 Other iron deficiency anemias: Secondary | ICD-10-CM

## 2022-06-21 ENCOUNTER — Inpatient Hospital Stay: Payer: 59 | Attending: Hematology & Oncology

## 2022-06-21 ENCOUNTER — Telehealth: Payer: Self-pay | Admitting: *Deleted

## 2022-06-21 ENCOUNTER — Inpatient Hospital Stay (HOSPITAL_BASED_OUTPATIENT_CLINIC_OR_DEPARTMENT_OTHER): Payer: 59 | Admitting: Family

## 2022-06-21 DIAGNOSIS — Z79899 Other long term (current) drug therapy: Secondary | ICD-10-CM

## 2022-06-21 DIAGNOSIS — D500 Iron deficiency anemia secondary to blood loss (chronic): Secondary | ICD-10-CM

## 2022-06-21 DIAGNOSIS — D509 Iron deficiency anemia, unspecified: Secondary | ICD-10-CM | POA: Diagnosis not present

## 2022-06-21 DIAGNOSIS — F17210 Nicotine dependence, cigarettes, uncomplicated: Secondary | ICD-10-CM | POA: Insufficient documentation

## 2022-06-21 DIAGNOSIS — D508 Other iron deficiency anemias: Secondary | ICD-10-CM

## 2022-06-21 LAB — CBC WITH DIFFERENTIAL (CANCER CENTER ONLY)
Abs Immature Granulocytes: 0.02 10*3/uL (ref 0.00–0.07)
Basophils Absolute: 0 10*3/uL (ref 0.0–0.1)
Basophils Relative: 1 %
Eosinophils Absolute: 0.2 10*3/uL (ref 0.0–0.5)
Eosinophils Relative: 3 %
HCT: 42.3 % (ref 36.0–46.0)
Hemoglobin: 14.2 g/dL (ref 12.0–15.0)
Immature Granulocytes: 0 %
Lymphocytes Relative: 33 %
Lymphs Abs: 1.9 10*3/uL (ref 0.7–4.0)
MCH: 29.7 pg (ref 26.0–34.0)
MCHC: 33.6 g/dL (ref 30.0–36.0)
MCV: 88.5 fL (ref 80.0–100.0)
Monocytes Absolute: 0.6 10*3/uL (ref 0.1–1.0)
Monocytes Relative: 10 %
Neutro Abs: 3 10*3/uL (ref 1.7–7.7)
Neutrophils Relative %: 53 %
Platelet Count: 310 10*3/uL (ref 150–400)
RBC: 4.78 MIL/uL (ref 3.87–5.11)
RDW: 14.7 % (ref 11.5–15.5)
WBC Count: 5.8 10*3/uL (ref 4.0–10.5)
nRBC: 0 % (ref 0.0–0.2)

## 2022-06-21 LAB — CMP (CANCER CENTER ONLY)
ALT: 11 U/L (ref 0–44)
AST: 15 U/L (ref 15–41)
Albumin: 4.8 g/dL (ref 3.5–5.0)
Alkaline Phosphatase: 43 U/L (ref 38–126)
Anion gap: 9 (ref 5–15)
BUN: 10 mg/dL (ref 6–20)
CO2: 28 mmol/L (ref 22–32)
Calcium: 10.1 mg/dL (ref 8.9–10.3)
Chloride: 100 mmol/L (ref 98–111)
Creatinine: 0.82 mg/dL (ref 0.44–1.00)
GFR, Estimated: >60 mL/min (ref >60)
Glucose, Bld: 90 mg/dL (ref 70–99)
Potassium: 4.3 mmol/L (ref 3.5–5.1)
Sodium: 137 mmol/L (ref 135–145)
Total Bilirubin: 0.6 mg/dL (ref 0.3–1.2)
Total Protein: 7.3 g/dL (ref 6.5–8.1)

## 2022-06-21 LAB — RETICULOCYTES
Immature Retic Fract: 10.6 % (ref 2.3–15.9)
RBC.: 4.79 MIL/uL (ref 3.87–5.11)
Retic Count, Absolute: 116.9 10*3/uL (ref 19.0–186.0)
Retic Ct Pct: 2.4 % (ref 0.4–3.1)

## 2022-06-21 LAB — FERRITIN: Ferritin: 16 ng/mL (ref 11–307)

## 2022-06-21 LAB — LACTATE DEHYDROGENASE: LDH: 113 U/L (ref 98–192)

## 2022-06-21 NOTE — Progress Notes (Signed)
Hematology/Oncology Consultation   Name: [name]      MRN: [id number]    Location: Room/bed info not found  Date: 06/21/2022 Time:11:26 AM   REFERRING PHYSICIAN:  Samuel Bouche, NP  REASON FOR CONSULT:  Iron deficiency anemia    DIAGNOSIS: iron deficiency anemia   HISTORY OF PRESENT ILLNESS: [name] is a very pleasant 46 yo caucasian female with long history of iron deficiency anemia.  She has regular cycles with severely heavy flow for up to 3 out of 6 days. She has also noted some clots. She has an appointment with gynecology on 07/24/2022 to discuss treatment options.  No other blood loss noted. No abnormal bruising, no petechiae.  She has been taking an oral iron supplement with a stool softener.  She is also on Prilosec daily for GERD which may also block some of her ability to absorb iron.  She is currently symptomatic with fatigue, weakness, SOB with exertion, lightheadedness, frequent yawning, brain fog, chills and numbness and tingling in her hands and feet.  No known familial history of anemia.  She has 2 sons and history of 2 miscarriages within the first 8 weeks.  She had 2 c-sections without any complications.  No history of diabetes or thyroid disease.  No personal or known familial history of cancer.  Her mother has had ovarian cysts in the past.  She had her first mammogram last year which showed cysts. They plan to re-evaluate later this year in December.  She has an itchy raised rash on her lower back which she states her dermatologist is aware of and watching.  She notes occasional night sweats.  No fever, n/v, cough, chest pain, palpitations, abdominal pain or changes in bowel or bladder habits.  No swelling or tenderness in her extremities.  No falls or syncope to report.  She smokes 1/2 ppd. Rare ETOH socially. No recreational drug use.  Her appetite and hydration are good. Her weight is stable at 151 lbs.  She works from home for [employer].   ROS:  All other 10 point review of systems is negative.   PAST MEDICAL HISTORY:   Past Medical History:  Diagnosis Date   Allergy    Anemia    Asthma    Depression    Hyperlipidemia    Hypertension     ALLERGIES: Allergies  Allergen Reactions   Amoxicillin Hives    Causes SEVERE hives (patient reports that she needed epi-pen/prednisone after receiving it).    Keflex [Cephalexin] Hives   Penicillins Hives   Lisinopril Cough      MEDICATIONS:  Current Outpatient Medications on File Prior to Visit  Medication Sig Dispense Refill   albuterol (VENTOLIN HFA) 108 (90 Base) MCG/ACT inhaler Inhale 1-2 puffs into the lungs every 6 (six) hours as needed for wheezing or shortness of breath. 18 g 0   amphetamine-dextroamphetamine (ADDERALL) 20 MG tablet Take 1 tablet (20 mg total) by mouth 2 (two) times daily. 60 tablet 0   FLUoxetine (PROZAC) 20 MG capsule Take 1 capsule (20 mg total) by mouth daily. 90 capsule 3   fluticasone (FLONASE) 50 MCG/ACT nasal spray Place 1 spray into both nostrils daily.     hydrochlorothiazide (HYDRODIURIL) 25 MG tablet Take 1 tablet (25 mg total) by mouth daily. 90 tablet 1   hydrOXYzine (VISTARIL) 25 MG capsule Take 1-2 capsules (25-50 mg total) by mouth at bedtime as needed for anxiety (and sleep). 60 capsule 0   ipratropium-albuterol (DUONEB) 0.5-2.5 (  3) MG/3ML SOLN Take 3 mLs by nebulization 3 (three) times daily.     loratadine (CLARITIN) 10 MG tablet Take 10 mg by mouth daily.     Multiple Vitamin (THERA) TABS Take 1 tablet by mouth daily.     omeprazole (PRILOSEC) 40 MG capsule Take 40 mg by mouth daily.     verapamil (VERELAN PM) 120 MG 24 hr capsule Take 1 capsule (120 mg total) by mouth at bedtime. 90 capsule 1   FLUoxetine (PROZAC) 10 MG capsule Take 1 capsule (10 mg total) by mouth daily. (Patient not taking: Reported on 06/21/2022) 7 capsule 0   [DISCONTINUED] Fluticasone-Salmeterol (ADVAIR) 250-50 MCG/DOSE AEPB Inhale 1 puff into the lungs every 12  (twelve) hours. 60 each 1   [DISCONTINUED] lisinopril (PRINIVIL,ZESTRIL) 40 MG tablet Take 40 mg by mouth daily.     No current facility-administered medications on file prior to visit.     PAST SURGICAL HISTORY Past Surgical History:  Procedure Laterality Date   CESAREAN SECTION     CESAREAN SECTION  04/10/2012   Procedure: CESAREAN SECTION;  Surgeon: Marylynn Pearson, MD;  Location: [facility];  Service: Gynecology;  Laterality: N/A;   WISDOM TOOTH EXTRACTION      FAMILY HISTORY: Family History  Problem Relation Age of Onset   Hypertension Mother    Hypertension Father    Hypertension Maternal Grandmother     SOCIAL HISTORY:  reports that she has been smoking cigarettes. She has a 10.00 pack-year smoking history. She has never used smokeless tobacco. She reports that she does not drink alcohol and does not use drugs.  PERFORMANCE STATUS: The patient's performance status is 1 - Symptomatic but completely ambulatory  PHYSICAL EXAM: Most Recent Vital Signs: There were no vitals taken for this visit. There were no vitals taken for this visit.  General Appearance:    Alert, cooperative, no distress, appears stated age  Head:    Normocephalic, without obvious abnormality, atraumatic  Eyes:    PERRL, conjunctiva/corneas clear, EOM's intact, fundi    benign, both eyes        Throat:   Lips, mucosa, and tongue normal; teeth and gums normal  Neck:   Supple, symmetrical, trachea midline, no adenopathy;    thyroid:  no enlargement/tenderness/nodules; no carotid   bruit or JVD  Back:     Symmetric, no curvature, ROM normal, no CVA tenderness  Lungs:     Clear to auscultation bilaterally, respirations unlabored  Chest Wall:    No tenderness or deformity   Heart:    Regular rate and rhythm, S1 and S2 normal, no murmur, rub   or gallop     Abdomen:     Soft, non-tender, bowel sounds active all four quadrants,    no masses, no organomegaly        Extremities:   Extremities normal,  atraumatic, no cyanosis or edema  Pulses:   2+ and symmetric all extremities  Skin:   Skin color, texture, turgor normal, no rashes or lesions  Lymph nodes:   Cervical, supraclavicular, and axillary nodes normal  Neurologic:   CNII-XII intact, normal strength, sensation and reflexes    throughout    LABORATORY DATA:  Results for orders placed or performed in visit on 06/21/22 (from the past 48 hour(s))  CBC with Differential (Cancer Center Only)     Status: None   Collection Time: 06/21/22 10:27 AM  Result Value Ref Range   WBC Count 5.8 4.0 - 10.5 K/uL  RBC 4.78 3.87 - 5.11 MIL/uL   Hemoglobin 14.2 12.0 - 15.0 g/dL   HCT 42.3 36.0 - 46.0 %   MCV 88.5 80.0 - 100.0 fL   MCH 29.7 26.0 - 34.0 pg   MCHC 33.6 30.0 - 36.0 g/dL   RDW 14.7 11.5 - 15.5 %   Platelet Count 310 150 - 400 K/uL   nRBC 0.0 0.0 - 0.2 %   Neutrophils Relative % 53 %   Neutro Abs 3.0 1.7 - 7.7 K/uL   Lymphocytes Relative 33 %   Lymphs Abs 1.9 0.7 - 4.0 K/uL   Monocytes Relative 10 %   Monocytes Absolute 0.6 0.1 - 1.0 K/uL   Eosinophils Relative 3 %   Eosinophils Absolute 0.2 0.0 - 0.5 K/uL   Basophils Relative 1 %   Basophils Absolute 0.0 0.0 - 0.1 K/uL   Immature Granulocytes 0 %   Abs Immature Granulocytes 0.02 0.00 - 0.07 K/uL    Comment: Performed at [facility] at [facility], [address], [place], [place] [address]  CMP ([place] only)     Status: None   Collection Time: 06/21/22 10:27 AM  Result Value Ref Range   Sodium 137 135 - 145 mmol/L   Potassium 4.3 3.5 - 5.1 mmol/L   Chloride 100 98 - 111 mmol/L   CO2 28 22 - 32 mmol/L   Glucose, Bld 90 70 - 99 mg/dL    Comment: Glucose reference range applies only to samples taken after fasting for at least 8 hours.   BUN 10 6 - 20 mg/dL   Creatinine 0.82 0.44 - 1.00 mg/dL   Calcium 10.1 8.9 - 10.3 mg/dL   Total Protein 7.3 6.5 - 8.1 g/dL   Albumin 4.8 3.5 - 5.0 g/dL   AST 15 15 - 41 U/L   ALT 11 0 - 44 U/L    Alkaline Phosphatase 43 38 - 126 U/L   Total Bilirubin 0.6 0.3 - 1.2 mg/dL   GFR, Estimated >60 >60 mL/min    Comment: (NOTE) Calculated using the CKD-EPI Creatinine Equation (2021)    Anion gap 9 5 - 15    Comment: Performed at [facility] Lab at [facility], [address] [place], [place] [address]  Reticulocytes     Status: None   Collection Time: 06/21/22 10:28 AM  Result Value Ref Range   Retic Ct Pct 2.4 0.4 - 3.1 %   RBC. 4.79 3.87 - 5.11 MIL/uL   Retic Count, Absolute 116.9 19.0 - 186.0 K/uL   Immature Retic Fract 10.6 2.3 - 15.9 %    Comment: Performed at [facility] at [facility], [address] [place], [place] [address]      RADIOGRAPHY: No results found.     PATHOLOGY: None   ASSESSMENT/PLAN: Ms. Ghan is a very pleasant 46 yo caucasian female with long history of iron deficiency anemia.  Her anemia work up is pending.  We will get her set up for IV iron if needed.  Follow-up in 2 months.   All questions were answered. The patient knows to call the clinic with any problems, questions or concerns. We can certainly see the patient much sooner if necessary.  The patient was discussed with Dr. Marin Olp and he is in agreement with the aforementioned.   Lottie Dawson, NP

## 2022-06-21 NOTE — Telephone Encounter (Signed)
Per 06/21/22 los - called and gave upcoming appointments - confirmed

## 2022-06-22 LAB — IRON AND IRON BINDING CAPACITY (CC-WL,HP ONLY)
Iron: 92 ug/dL (ref 28–170)
Saturation Ratios: 22 % (ref 10.4–31.8)
TIBC: 426 ug/dL (ref 250–450)
UIBC: 334 ug/dL (ref 148–442)

## 2022-06-23 ENCOUNTER — Other Ambulatory Visit: Payer: Self-pay | Admitting: Medical-Surgical

## 2022-06-23 LAB — ERYTHROPOIETIN: Erythropoietin: 20.6 m[IU]/mL — ABNORMAL HIGH (ref 2.6–18.5)

## 2022-06-29 ENCOUNTER — Other Ambulatory Visit: Payer: Self-pay | Admitting: Medical-Surgical

## 2022-06-29 MED ORDER — AMPHETAMINE-DEXTROAMPHETAMINE 20 MG PO TABS
20.0000 mg | ORAL_TABLET | Freq: Two times a day (BID) | ORAL | 0 refills | Status: DC
Start: 2022-06-29 — End: 2022-07-18

## 2022-07-07 MED ORDER — VERAPAMIL HCL ER 120 MG PO CP24: 120.0000 mg | ORAL_CAPSULE | Freq: Every day | ORAL | 1 refills | Status: DC

## 2022-07-07 NOTE — Addendum Note (Signed)
Addended bySamuel Bouche on: 07/07/2022 05:09 PM   Modules accepted: Orders

## 2022-07-17 NOTE — Progress Notes (Unsigned)
   Established Patient Office Visit  Subjective   Patient ID: [name], female   DOB: 18-Dec-1975 Age: 46 y.o. MRN: [id number]   No chief complaint on file.   HPI Pleasant 46 year old female presenting today for follow-up on mood. Mood concerns Anxiety: *** Depression: *** Contributing factors: *** Medications: *** Compliant: *** Meds helpful: *** Side effects: *** Past medications: *** Counseling: *** SI/HI: ***  ADHD:   Objective:    There were no vitals filed for this visit.  Physical Exam   No results found for this or any previous visit (from the past 24 hour(s)).   {Labs (Optional):23779}  The 10-year ASCVD risk score (Arnett DK, et al., 2019) is: 2.1%   Values used to calculate the score:     Age: 40 years     Sex: Female     Is Non-Hispanic African American: No     Diabetic: No     Tobacco smoker: Yes     Systolic Blood Pressure: 761 mmHg     Is BP treated: Yes     HDL Cholesterol: 84 mg/dL     Total Cholesterol: 198 mg/dL   Assessment & Plan:   No problem-specific Assessment & Plan notes found for this encounter.   No follow-ups on file.  ___________________________________________ Clearnce Sorrel, DNP, APRN, FNP-BC Primary Care and [place]

## 2022-07-18 ENCOUNTER — Encounter: Payer: Self-pay | Admitting: Medical-Surgical

## 2022-07-18 ENCOUNTER — Ambulatory Visit (INDEPENDENT_AMBULATORY_CARE_PROVIDER_SITE_OTHER): Payer: 59 | Admitting: Medical-Surgical

## 2022-07-18 VITALS — BP 130/83 | HR 85 | Resp 20 | Ht 61.0 in | Wt 149.0 lb

## 2022-07-18 DIAGNOSIS — F411 Generalized anxiety disorder: Secondary | ICD-10-CM | POA: Diagnosis not present

## 2022-07-18 DIAGNOSIS — F902 Attention-deficit hyperactivity disorder, combined type: Secondary | ICD-10-CM | POA: Diagnosis not present

## 2022-07-18 MED ORDER — AMPHETAMINE-DEXTROAMPHETAMINE 20 MG PO TABS: 20.0000 mg | ORAL_TABLET | Freq: Two times a day (BID) | ORAL | 0 refills | Status: DC

## 2022-07-24 ENCOUNTER — Encounter: Payer: 59 | Admitting: Obstetrics & Gynecology

## 2022-08-22 ENCOUNTER — Other Ambulatory Visit: Payer: Self-pay | Admitting: Family

## 2022-08-22 ENCOUNTER — Telehealth: Payer: Self-pay | Admitting: *Deleted

## 2022-08-22 ENCOUNTER — Inpatient Hospital Stay (HOSPITAL_BASED_OUTPATIENT_CLINIC_OR_DEPARTMENT_OTHER): Payer: 59 | Admitting: Family

## 2022-08-22 ENCOUNTER — Inpatient Hospital Stay: Payer: 59 | Attending: Hematology & Oncology

## 2022-08-22 ENCOUNTER — Encounter: Payer: Self-pay | Admitting: Family

## 2022-08-22 VITALS — BP 146/94 | HR 88 | Temp 98.3°F | Resp 18 | Wt 149.0 lb

## 2022-08-22 DIAGNOSIS — D500 Iron deficiency anemia secondary to blood loss (chronic): Secondary | ICD-10-CM

## 2022-08-22 DIAGNOSIS — D508 Other iron deficiency anemias: Secondary | ICD-10-CM

## 2022-08-22 DIAGNOSIS — N920 Excessive and frequent menstruation with regular cycle: Secondary | ICD-10-CM | POA: Insufficient documentation

## 2022-08-22 LAB — CBC WITH DIFFERENTIAL (CANCER CENTER ONLY)
Abs Immature Granulocytes: 0.07 10*3/uL (ref 0.00–0.07)
Basophils Absolute: 0 10*3/uL (ref 0.0–0.1)
Basophils Relative: 0 %
Eosinophils Absolute: 0.2 10*3/uL (ref 0.0–0.5)
Eosinophils Relative: 3 %
HCT: 42.2 % (ref 36.0–46.0)
Hemoglobin: 14.2 g/dL (ref 12.0–15.0)
Immature Granulocytes: 1 %
Lymphocytes Relative: 28 %
Lymphs Abs: 2 10*3/uL (ref 0.7–4.0)
MCH: 30.6 pg (ref 26.0–34.0)
MCHC: 33.6 g/dL (ref 30.0–36.0)
MCV: 90.9 fL (ref 80.0–100.0)
Monocytes Absolute: 0.5 10*3/uL (ref 0.1–1.0)
Monocytes Relative: 7 %
Neutro Abs: 4.3 10*3/uL (ref 1.7–7.7)
Neutrophils Relative %: 61 %
Platelet Count: 328 10*3/uL (ref 150–400)
RBC: 4.64 MIL/uL (ref 3.87–5.11)
RDW: 13.2 % (ref 11.5–15.5)
WBC Count: 7.1 10*3/uL (ref 4.0–10.5)
nRBC: 0 % (ref 0.0–0.2)

## 2022-08-22 LAB — IRON AND IRON BINDING CAPACITY (CC-WL,HP ONLY)
Iron: 92 ug/dL (ref 28–170)
Saturation Ratios: 23 % (ref 10.4–31.8)
TIBC: 405 ug/dL (ref 250–450)
UIBC: 313 ug/dL (ref 148–442)

## 2022-08-22 LAB — RETICULOCYTES
Immature Retic Fract: 9.1 % (ref 2.3–15.9)
RBC.: 4.6 MIL/uL (ref 3.87–5.11)
Retic Count, Absolute: 84.6 10*3/uL (ref 19.0–186.0)
Retic Ct Pct: 1.8 % (ref 0.4–3.1)

## 2022-08-22 LAB — FERRITIN: Ferritin: 10 ng/mL — ABNORMAL LOW (ref 11–307)

## 2022-08-22 NOTE — Telephone Encounter (Signed)
Per 08/22/25 los - gave upcoming appointments - confirmed

## 2022-08-22 NOTE — Progress Notes (Signed)
Hematology and Oncology Follow Up Visit  [name] [id number] Jun 20, 1976 46 y.o. 08/22/2022   Principle Diagnosis:  Iron deficiency anemia secondary to heavy cycles   Current Therapy:   IV iron as indicated    Interim History:  Ms. [name] is here today for follow-up. She is symptomatic with fatigue, brian fog and mild SOB with exertion.  She states that she just finished her cycle and her flow is still very heavy.  She has an appointment with gynecology on 09/25/2022.  She also had to have her left back bottom tooth removed last week on Friday. She is still a little sore.  No other blood loss noted. No bruising or petechiae.  No fever, chills, n/v, cough, rash, dizziness, chest pain, palpitations, abdominal pain or changes in bowel or bladder habits.  No swelling, tenderness, numbness or tingling in her extremities.  No falls or syncope.  Appetite and hydration are good. Weight is stable at 149 lbs.   ECOG Performance Status: 1 - Symptomatic but completely ambulatory  Medications:  Allergies as of 08/22/2022       Reactions   Mosquito (diagnostic) Swelling   Amoxicillin Hives   Causes SEVERE hives (patient reports that she needed epi-pen/prednisone after receiving it).    Keflex [cephalexin] Hives   Penicillins Hives   Lisinopril Cough        Medication List        Accurate as of August 22, 2022 10:26 AM. If you have any questions, ask your nurse or doctor.          acetaminophen 500 MG tablet Commonly known as: TYLENOL Take 500-1,000 mg by mouth every 6 (six) hours as needed.   albuterol 108 (90 Base) MCG/ACT inhaler Commonly known as: VENTOLIN HFA Inhale 1-2 puffs into the lungs every 6 (six) hours as needed for wheezing or shortness of breath.   amphetamine-dextroamphetamine 20 MG tablet Commonly known as: ADDERALL Take 1 tablet (20 mg total) by mouth 2 (two) times daily.   amphetamine-dextroamphetamine 20 MG tablet Commonly known as:  ADDERALL Take 1 tablet (20 mg total) by mouth 2 (two) times daily. Start taking on: August 28, 2022   cyclobenzaprine 10 MG tablet Commonly known as: FLEXERIL Take 10 mg by mouth at bedtime.   ferrous sulfate 325 (65 FE) MG tablet Take 325 mg by mouth daily with breakfast.   FLUoxetine 20 MG capsule Commonly known as: PROZAC Take 1 capsule (20 mg total) by mouth daily.   fluticasone 50 MCG/ACT nasal spray Commonly known as: FLONASE Place 1 spray into both nostrils daily.   hydrochlorothiazide 25 MG tablet Commonly known as: HYDRODIURIL Take 1 tablet (25 mg total) by mouth daily.   HYDROcodone-acetaminophen 5-325 MG tablet Commonly known as: NORCO/VICODIN Take 1 tablet by mouth every 6 (six) hours as needed.   hydrOXYzine 25 MG capsule Commonly known as: VISTARIL Take 1-2 capsules (25-50 mg total) by mouth at bedtime as needed for anxiety (and sleep).   ibuprofen 600 MG tablet Commonly known as: ADVIL Take 600 mg by mouth every 6 (six) hours as needed.   ipratropium-albuterol 0.5-2.5 (3) MG/3ML Soln Commonly known as: DUONEB Take 3 mLs by nebulization 3 (three) times daily.   loratadine 10 MG tablet Commonly known as: CLARITIN Take 10 mg by mouth daily.   omeprazole 40 MG capsule Commonly known as: PRILOSEC Take 40 mg by mouth daily.   predniSONE 20 MG tablet Commonly known as: DELTASONE Take 20 mg by mouth 2 (two) times  daily. PRN for asthma   Symbicort 160-4.5 MCG/ACT inhaler Generic drug: budesonide-formoterol Inhale 2 puffs into the lungs 2 (two) times daily.   Thera Tabs Take 1 tablet by mouth daily.   verapamil 120 MG 24 hr capsule Commonly known as: VERELAN PM Take 1 capsule (120 mg total) by mouth at bedtime.   VITAMIN C PO Take 2 tablets by mouth daily at 6 (six) AM. Unsure dose - takes with ferrous sulfate        Allergies:  Allergies  Allergen Reactions   Mosquito (Diagnostic) Swelling   Amoxicillin Hives    Causes SEVERE hives  (patient reports that she needed epi-pen/prednisone after receiving it).    Keflex [Cephalexin] Hives   Penicillins Hives   Lisinopril Cough    Past Medical History, Surgical history, Social history, and Family History were reviewed and updated.  Review of Systems: All other 10 point review of systems is negative.   Physical Exam:  weight is 149 lb (67.6 kg). Her oral temperature is 98.3 F (36.8 C). Her blood pressure is 143/100 (abnormal) and her pulse is 88. Her respiration is 18 and oxygen saturation is 100%.   Wt Readings from Last 3 Encounters:  08/22/22 149 lb (67.6 kg)  07/18/22 149 lb (67.6 kg)  05/29/22 159 lb 9.6 oz (72.4 kg)    Ocular: Sclerae unicteric, pupils equal, round and reactive to light Ear-nose-throat: Oropharynx clear, dentition fair Lymphatic: No cervical or supraclavicular adenopathy Lungs no rales or rhonchi, good excursion bilaterally Heart regular rate and rhythm, no murmur appreciated Abd soft, nontender, positive bowel sounds MSK no focal spinal tenderness, no joint edema Neuro: non-focal, well-oriented, appropriate affect Breasts: Deferred   Lab Results  Component Value Date   WBC 7.1 08/22/2022   HGB 14.2 08/22/2022   HCT 42.2 08/22/2022   MCV 90.9 08/22/2022   PLT 328 08/22/2022   Lab Results  Component Value Date   FERRITIN 16 06/21/2022   IRON 92 06/21/2022   TIBC 426 06/21/2022   UIBC 334 06/21/2022   IRONPCTSAT 22 06/21/2022   Lab Results  Component Value Date   RETICCTPCT 1.8 08/22/2022   RBC 4.60 08/22/2022   RBC 4.64 08/22/2022   No results found for: "KPAFRELGTCHN", "LAMBDASER", "KAPLAMBRATIO" No results found for: "IGGSERUM", "IGA", "IGMSERUM" No results found for: "TOTALPROTELP", "ALBUMINELP", "A1GS", "A2GS", "BETS", "BETA2SER", "GAMS", "MSPIKE", "SPEI"   Chemistry      Component Value Date/Time   NA 137 06/21/2022 1027   K 4.3 06/21/2022 1027   CL 100 06/21/2022 1027   CO2 28 06/21/2022 1027   BUN 10 06/21/2022  1027   CREATININE 0.82 06/21/2022 1027   CREATININE 0.59 01/27/2022 0000      Component Value Date/Time   CALCIUM 10.1 06/21/2022 1027   ALKPHOS 43 06/21/2022 1027   AST 15 06/21/2022 1027   ALT 11 06/21/2022 1027   BILITOT 0.6 06/21/2022 1027       Impression and Plan: [name] is a very pleasant 46 yo caucasian female with long history of iron deficiency anemia.  Iron studies are pending. We will replace if needed.  Follow-up in 4 months.   Lottie Dawson, NP 9/26/202310:26 AM

## 2022-08-23 ENCOUNTER — Encounter (INDEPENDENT_AMBULATORY_CARE_PROVIDER_SITE_OTHER): Payer: 59 | Admitting: Medical-Surgical

## 2022-08-23 DIAGNOSIS — F17210 Nicotine dependence, cigarettes, uncomplicated: Secondary | ICD-10-CM | POA: Diagnosis not present

## 2022-08-23 DIAGNOSIS — F411 Generalized anxiety disorder: Secondary | ICD-10-CM

## 2022-08-24 ENCOUNTER — Other Ambulatory Visit: Payer: Self-pay | Admitting: Medical-Surgical

## 2022-08-24 MED ORDER — HYDROXYZINE PAMOATE 25 MG PO CAPS: 25.0000 mg | ORAL_CAPSULE | Freq: Three times a day (TID) | ORAL | 0 refills | Status: DC | PRN

## 2022-08-25 MED ORDER — NICOTINE 21 MG/24HR TD PT24: 21.0000 mg | MEDICATED_PATCH | Freq: Every day | TRANSDERMAL | 0 refills | Status: DC

## 2022-08-25 NOTE — Telephone Encounter (Signed)
Please see the MyChart message reply(ies) for my assessment and plan.    This patient gave consent for this Medical Advice Message and is aware that it may result in a bill to their insurance company, as well as the possibility of receiving a bill for a co-payment or deductible. They are an established patient, but are not seeking medical advice exclusively about a problem treated during an in person or video visit in the last seven days. I did not recommend an in person or video visit within seven days of my reply.    I spent a total of 8 minutes cumulative time within 7 days through MyChart messaging.  Addelyn Alleman, NP   

## 2022-08-31 ENCOUNTER — Inpatient Hospital Stay: Payer: 59 | Attending: Hematology & Oncology

## 2022-08-31 VITALS — BP 135/89 | HR 58 | Temp 98.1°F | Resp 18

## 2022-08-31 DIAGNOSIS — N920 Excessive and frequent menstruation with regular cycle: Secondary | ICD-10-CM | POA: Diagnosis present

## 2022-08-31 DIAGNOSIS — D500 Iron deficiency anemia secondary to blood loss (chronic): Secondary | ICD-10-CM | POA: Diagnosis not present

## 2022-08-31 DIAGNOSIS — D508 Other iron deficiency anemias: Secondary | ICD-10-CM

## 2022-08-31 MED ORDER — SODIUM CHLORIDE 0.9% FLUSH: 10.0000 mL | Freq: Once | INTRAVENOUS | Status: DC | PRN

## 2022-08-31 MED ORDER — SODIUM CHLORIDE 0.9 % IV SOLN: Freq: Once | INTRAVENOUS | Status: AC

## 2022-08-31 MED ORDER — SODIUM CHLORIDE 0.9 % IV SOLN
300.0000 mg | Freq: Once | INTRAVENOUS | Status: AC
  Administered 2022-08-31: 300 mg via INTRAVENOUS
  Filled 2022-08-31: qty 300

## 2022-08-31 NOTE — Patient Instructions (Signed)

## 2022-09-06 ENCOUNTER — Inpatient Hospital Stay: Payer: 59

## 2022-09-06 VITALS — BP 136/84 | HR 75 | Temp 97.8°F | Resp 18

## 2022-09-06 DIAGNOSIS — D508 Other iron deficiency anemias: Secondary | ICD-10-CM

## 2022-09-06 DIAGNOSIS — D500 Iron deficiency anemia secondary to blood loss (chronic): Secondary | ICD-10-CM | POA: Diagnosis not present

## 2022-09-06 MED ORDER — SODIUM CHLORIDE 0.9 % IV SOLN
300.0000 mg | Freq: Once | INTRAVENOUS | Status: AC
  Administered 2022-09-06: 300 mg via INTRAVENOUS
  Filled 2022-09-06: qty 300

## 2022-09-06 MED ORDER — SODIUM CHLORIDE 0.9 % IV SOLN: Freq: Once | INTRAVENOUS | Status: AC

## 2022-09-06 NOTE — Patient Instructions (Signed)

## 2022-09-13 ENCOUNTER — Inpatient Hospital Stay: Payer: 59

## 2022-09-13 VITALS — BP 132/78 | HR 80 | Temp 98.1°F | Resp 20

## 2022-09-13 DIAGNOSIS — D500 Iron deficiency anemia secondary to blood loss (chronic): Secondary | ICD-10-CM | POA: Diagnosis not present

## 2022-09-13 DIAGNOSIS — D508 Other iron deficiency anemias: Secondary | ICD-10-CM

## 2022-09-13 MED ORDER — SODIUM CHLORIDE 0.9 % IV SOLN
300.0000 mg | Freq: Once | INTRAVENOUS | Status: AC
  Administered 2022-09-13: 300 mg via INTRAVENOUS
  Filled 2022-09-13: qty 300

## 2022-09-13 MED ORDER — SODIUM CHLORIDE 0.9 % IV SOLN: Freq: Once | INTRAVENOUS | Status: AC

## 2022-09-13 NOTE — Patient Instructions (Signed)

## 2022-09-15 ENCOUNTER — Other Ambulatory Visit: Payer: Self-pay

## 2022-09-15 DIAGNOSIS — F902 Attention-deficit hyperactivity disorder, combined type: Secondary | ICD-10-CM

## 2022-09-15 MED ORDER — AMPHETAMINE-DEXTROAMPHETAMINE 20 MG PO TABS: 20.0000 mg | ORAL_TABLET | Freq: Two times a day (BID) | ORAL | 0 refills | Status: DC

## 2022-09-15 NOTE — Progress Notes (Signed)
Patient is needing this Rx sent to [facility] instead of [facility]. [facility] is out of stock.

## 2022-09-15 NOTE — Progress Notes (Signed)
..  PDMP reviewed during this encounter. Sent refills.

## 2022-09-25 ENCOUNTER — Other Ambulatory Visit (HOSPITAL_COMMUNITY)
Admission: RE | Admit: 2022-09-25 | Discharge: 2022-09-25 | Disposition: A | Discharge status: Home / Self Care | Payer: 59 | Source: Ambulatory Visit | Attending: Obstetrics & Gynecology | Admitting: Obstetrics & Gynecology

## 2022-09-25 ENCOUNTER — Ambulatory Visit (INDEPENDENT_AMBULATORY_CARE_PROVIDER_SITE_OTHER): Payer: 59 | Admitting: Obstetrics & Gynecology

## 2022-09-25 ENCOUNTER — Encounter: Payer: Self-pay | Admitting: Obstetrics & Gynecology

## 2022-09-25 VITALS — BP 146/90 | HR 86 | Ht 61.0 in | Wt 151.0 lb

## 2022-09-25 DIAGNOSIS — N898 Other specified noninflammatory disorders of vagina: Secondary | ICD-10-CM

## 2022-09-25 DIAGNOSIS — Z01419 Encounter for gynecological examination (general) (routine) without abnormal findings: Secondary | ICD-10-CM | POA: Diagnosis present

## 2022-09-25 DIAGNOSIS — N888 Other specified noninflammatory disorders of cervix uteri: Secondary | ICD-10-CM

## 2022-09-25 DIAGNOSIS — N920 Excessive and frequent menstruation with regular cycle: Secondary | ICD-10-CM | POA: Diagnosis not present

## 2022-09-25 DIAGNOSIS — R300 Dysuria: Secondary | ICD-10-CM | POA: Diagnosis not present

## 2022-09-25 LAB — POCT URINALYSIS DIPSTICK
Bilirubin, UA: NEGATIVE
Glucose, UA: NEGATIVE
Ketones, UA: NEGATIVE
Leukocytes, UA: NEGATIVE
Nitrite, UA: NEGATIVE
Protein, UA: NEGATIVE
Spec Grav, UA: 1.02 (ref 1.010–1.025)
Urobilinogen, UA: 0.2 E.U./dL
pH, UA: 5.5 (ref 5.0–8.0)

## 2022-09-25 NOTE — Progress Notes (Signed)
Last Mammogram: 05/11/22- found cluster of cysts and had u/s- f/u breast u/s on 11/24/22 Last Pap Smear:  10 years ago- normal Last Colon Screening;  n/a Seat Belts:   yes Sun Screen:   yes Dental Check Up:  yes Brush & Floss:  yes  Pt c/o vaginal odor, irritation and dysuria

## 2022-09-25 NOTE — Progress Notes (Signed)
Subjective:     [name] is a 46 y.o. female here for a routine exam.  Current complaints: Pt c/o vaginal odor, irritation and dysuria.  Montly menstruation--very heavy and painful; Tampons changing hourly for several days.  She has to alter her ADLs to manage her menstrual flow. Has had to get iron infusions.     Gynecologic History Patient's last menstrual period was 08/14/2022. Contraception: coitus interruptus and condoms Last Mammogram: 05/11/22- found cluster of cysts and had u/s- f/u breast u/s on 11/24/22 Last Pap Smear:  10 years ago- normal Last Colon Screening;  n/a Seat Belts:   yes Sun Screen:   yes Dental Check Up:  yes Brush & Floss:  yes  Obstetric History OB History  Gravida Para Term Preterm AB Living  '6 2 2   4 2  '$ SAB IAB Ectopic Multiple Live Births  '2 2     1    '$ # Outcome Date GA Lbr Len/2nd Weight Sex Delivery Anes PTL Lv  6 Term 04/10/12 [redacted]w[redacted]d 6 lb 7 oz (2.92 kg) M CS-LTranv Spinal  LIV  5 IAB           4 IAB           3 SAB           2 SAB           1 Term              The following portions of the patient's history were reviewed and updated as appropriate: allergies, current medications, past family history, past medical history, past social history, past surgical history, and problem list.  Review of Systems Pertinent items noted in HPI and remainder of comprehensive ROS otherwise negative.    Objective:     Vitals:   09/25/22 0925  BP: (!) 150/90  Pulse: 86  Weight: 151 lb (68.5 kg)  Height: '5\' 1"'$  (1.549 m)   Vitals:  WNL General appearance: alert, cooperative and no distress  HEENT: Normocephalic, without obvious abnormality, atraumatic Eyes: negative Throat: lips, mucosa, and tongue normal; teeth and gums normal  Respiratory: Clear to auscultation bilaterally  CV: Regular rate and rhythm  Breasts:  Normal appearance, no masses or tenderness, no nipple retraction or dimpling  GI: Soft, non-tender; bowel sounds normal; no  masses,  no organomegaly  GU: External Genitalia:  Tanner V, no lesion Urethra:  No prolapse   Vagina: Pink, normal rugae, no blood or discharge  Cervix: No CMT, ?nabothian cyst at 9 o'clock  Uterus:  Normal size and contour, non tender  Adnexa: Normal, no masses, non tender  Musculoskeletal: No edema, redness or tenderness in the calves or thighs  Skin: No lesions or rash  Lymphatic: Axillary adenopathy: none     Psychiatric: Normal mood and behavior        Assessment:    Healthy female exam.    Plan:   Pap with cotesting Yearly mammograms Dysuria--UA/U Cx; patient has used Azo over-the-counter. Vaginal odor and irritation:  aptima sent--will screen for all vaginitis and infections Birth control--patient uses coitus interruptus and occasional condom for birth control.  They are not having intercourse very frequently due to busy schedules.  We did discuss Plan B. Menorrhagia--patient has menorrhagia with regular cycle.  We will get a pelvic ultrasound complete with transvaginal.  We will look for structural abnormalities or polyp.  We will base neck steps on results.  This could include endometrial biopsy, IUD, or surgical.  Low libido--[name] admits to exhaustion.  Her youngest child is in travel baseball.  She also experiences anemia and needs iron infusions.  She is being treated for obstructive sleep apnea as well as depression.  Her primary care is managing these complaints and conditions.  She has decided to prioritize her own mental health and physical wellbeing to try to regain some balance. HTN--follow-up with PCP.  Patient admits to being nervous before today's exam.  This could be exacerbating her high blood pressure.  Cervical Biopsy After informed verbal consent was obtained, a Kevorkian forcep was used to take a biopsy of the cervix at 9:00.  Moderate amount of mucus drained from the cyst.  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   Biopsy was obtained due to ups in cervical screening.  She had a LEEP approximately 10 years ago and did not have a follow-up Pap smear until today.

## 2022-09-26 LAB — CERVICOVAGINAL ANCILLARY ONLY
Bacterial Vaginitis (gardnerella): NEGATIVE
Candida Glabrata: NEGATIVE
Candida Vaginitis: NEGATIVE
Chlamydia: NEGATIVE
Comment: NEGATIVE
Comment: NEGATIVE
Comment: NEGATIVE
Comment: NEGATIVE
Comment: NORMAL
Neisseria Gonorrhea: NEGATIVE

## 2022-09-26 LAB — CYTOLOGY - PAP
Comment: NEGATIVE
Diagnosis: NEGATIVE
High risk HPV: NEGATIVE

## 2022-09-27 ENCOUNTER — Ambulatory Visit (INDEPENDENT_AMBULATORY_CARE_PROVIDER_SITE_OTHER): Payer: 59

## 2022-09-27 ENCOUNTER — Encounter: Payer: Self-pay | Admitting: Obstetrics & Gynecology

## 2022-09-27 DIAGNOSIS — N920 Excessive and frequent menstruation with regular cycle: Secondary | ICD-10-CM | POA: Diagnosis not present

## 2022-09-27 DIAGNOSIS — D259 Leiomyoma of uterus, unspecified: Secondary | ICD-10-CM | POA: Insufficient documentation

## 2022-09-27 DIAGNOSIS — D250 Submucous leiomyoma of uterus: Secondary | ICD-10-CM | POA: Insufficient documentation

## 2022-09-28 ENCOUNTER — Telehealth: Payer: Self-pay | Admitting: *Deleted

## 2022-09-28 LAB — URINE CULTURE
MICRO NUMBER:: 14122856
SPECIMEN QUALITY:: ADEQUATE

## 2022-09-28 NOTE — Telephone Encounter (Signed)
Left patient a message to call and schedule surgery consult with Dr. Damita Dunnings. Available days were left as of this time today.

## 2022-09-28 NOTE — Telephone Encounter (Signed)
-----   Message from Guss Bunde, MD sent at 09/27/2022  4:49 PM EDT ----- Please schedule a surgical consult visit with Dr. Damita Dunnings

## 2022-10-17 NOTE — Progress Notes (Unsigned)
   Established Patient Office Visit  Subjective   Patient ID: [name], female   DOB: 1976/10/15 Age: 46 y.o. MRN: [id number]   No chief complaint on file.   HPI    Objective:    There were no vitals filed for this visit.  Physical Exam   No results found for this or any previous visit (from the past 24 hour(s)).   {Labs (Optional):23779}  The 10-year ASCVD risk score (Arnett DK, et al., 2019) is: 2.3%   Values used to calculate the score:     Age: 21 years     Sex: Female     Is Non-Hispanic African American: No     Diabetic: No     Tobacco smoker: Yes     Systolic Blood Pressure: 438 mmHg     Is BP treated: Yes     HDL Cholesterol: 84 mg/dL     Total Cholesterol: 198 mg/dL   Assessment & Plan:   No problem-specific Assessment & Plan notes found for this encounter.   No follow-ups on file.  ___________________________________________ Clearnce Sorrel, DNP, APRN, FNP-BC Primary Care and [place]

## 2022-10-18 ENCOUNTER — Ambulatory Visit (INDEPENDENT_AMBULATORY_CARE_PROVIDER_SITE_OTHER): Payer: 59 | Admitting: Medical-Surgical

## 2022-10-18 ENCOUNTER — Encounter: Payer: Self-pay | Admitting: Medical-Surgical

## 2022-10-18 VITALS — BP 121/84 | HR 90 | Ht 61.0 in | Wt 153.0 lb

## 2022-10-18 DIAGNOSIS — F411 Generalized anxiety disorder: Secondary | ICD-10-CM | POA: Diagnosis not present

## 2022-10-18 DIAGNOSIS — F902 Attention-deficit hyperactivity disorder, combined type: Secondary | ICD-10-CM | POA: Diagnosis not present

## 2022-10-18 MED ORDER — AMPHETAMINE-DEXTROAMPHETAMINE 20 MG PO TABS: 20.0000 mg | ORAL_TABLET | Freq: Two times a day (BID) | ORAL | 0 refills | Status: DC

## 2022-10-18 MED ORDER — HYDROCHLOROTHIAZIDE 25 MG PO TABS: 25.0000 mg | ORAL_TABLET | Freq: Every day | ORAL | 1 refills | Status: DC

## 2022-10-18 MED ORDER — HYDROXYZINE PAMOATE 50 MG PO CAPS: 50.0000 mg | ORAL_CAPSULE | Freq: Three times a day (TID) | ORAL | 1 refills | Status: DC | PRN

## 2022-10-24 NOTE — Progress Notes (Signed)
GYNECOLOGY OFFICE VISIT NOTE  History:   [name] is a 46 y.o. [phone] here today for consultation regarding submucosal fibroid that is causing DUB and iron deficiency anemia. Her periods are veyr heavy and painful. She has had Iron infusions in the past. Last hemoglobin was in 08/22/2022 and was normal at 14.2 however her ferritin was 10 (has been as low as 2 in the past).   I reviewed the ultrasound images myself and noted: 9 mm intramural leiomyoma anterior LEFT uterus.  Additional submucosal leiomyoma posterior mid uterus 2.6 cm diameter - good candidate for sonata, hysteroscopic resection, ablation, etc.    ***    Past Medical History:  Diagnosis Date   Allergy    Anemia    Asthma    Depression    Hyperlipidemia    Hypertension    Vaginal Pap smear, abnormal     Past Surgical History:  Procedure Laterality Date   CESAREAN SECTION     CESAREAN SECTION  04/10/2012   Procedure: CESAREAN SECTION;  Surgeon: Marylynn Pearson, MD;  Location: [facility];  Service: Gynecology;  Laterality: N/A;   COLPOSCOPY     LEEP     WISDOM TOOTH EXTRACTION      The following portions of the patient's history were reviewed and updated as appropriate: allergies, current medications, past family history, past medical history, past social history, past surgical history and problem list.   Health Maintenance:   Normal pap and negative HRHPV:  Diagnosis  Date Value Ref Range Status  09/25/2022   Final   - Negative for intraepithelial lesion or malignancy (NILM)     Normal mammogram on 04/2022.   Review of Systems:  Pertinent items noted in HPI and remainder of comprehensive ROS otherwise negative.  Physical Exam:  There were no vitals taken for this visit. CONSTITUTIONAL: Well-developed, well-nourished female in no acute distress.  HEENT:  Normocephalic, atraumatic. External right and left ear normal. No scleral icterus.  NECK: Normal range of motion, supple, no masses noted on  observation SKIN: No rash noted. Not diaphoretic. No erythema. No pallor. MUSCULOSKELETAL: Normal range of motion. No edema noted. NEUROLOGIC: Alert and oriented to person, place, and time. Normal muscle tone coordination. No cranial nerve deficit noted. PSYCHIATRIC: Normal mood and affect. Normal behavior. Normal judgment and thought content.  CARDIOVASCULAR: Normal heart rate noted RESPIRATORY: Effort and breath sounds normal, no problems with respiration noted ABDOMEN: No masses noted. No other overt distention noted.    PELVIC: {Blank single:19197::"Deferred","Normal appearing external genitalia; normal urethral meatus; normal appearing vaginal mucosa and cervix.  No abnormal discharge noted.  Normal uterine size, no other palpable masses, no uterine or adnexal tenderness. Performed in the presence of a chaperone"}  Labs and Imaging No results found for this or any previous visit (from the past 168 hour(s)). US PELVIC COMPLETE WITH TRANSVAGINAL  Result Date: 09/27/2022 CLINICAL DATA:  Menorrhagia and dysmenorrhea for over 1 year, iron deficiency, LMP 08/14/2022 EXAM: TRANSABDOMINAL AND TRANSVAGINAL ULTRASOUND OF PELVIS TECHNIQUE: Both transabdominal and transvaginal ultrasound examinations of the pelvis were performed. Transabdominal technique was performed for global imaging of the pelvis including uterus, ovaries, adnexal regions, and pelvic cul-de-sac. It was necessary to proceed with endovaginal exam following the transabdominal exam to visualize the uterus, endometrium, and adnexa. COMPARISON:  None Available. FINDINGS: Uterus Measurements: 10.7 x 5.3 x 6.8 cm = volume: 200 mL. Anteverted. Slightly heterogeneous myometrium. Nabothian cyst at cervix. Tiny intramural leiomyoma anterior LEFT uterus 9 mm diameter. Additional  submucosal leiomyoma posterior mid uterus 2.6 cm diameter. Endometrium Thickness: 8 mm.  No endometrial fluid or mass Right ovary Measurements: 3.5 x 1.5 x 1.7 cm = volume:  4.5 mL. Normal morphology without mass Left ovary Measurements: 3.7 x 1.7 x 2.4 cm = volume: 7.8 mL. Normal morphology without mass Other findings No free pelvic fluid or adnexal masses. IMPRESSION: Two small uterine leiomyomata, largest 2.6 cm diameter submucosal at posterior mid uterus. Remainder of exam unremarkable. Electronically Signed   By: Lavonia Dana M.D.   On: 09/27/2022 16:19    Assessment and Plan:   1. Fibroids, submucosal - Uterine fibroids: The patient's fibroids are symptomatic and treatment options of expectant management, medical therapy, and surgical therapy were discussed. - Expectant management - The patient's fibroids were discussed and expectant management was offered with strict precautions. - TXA - this medication was discussed as a means to control vaginal bleeding. Discussed it would not impact growth of the fibroids either way. Its main advantage is avoiding hormonal or surgical therapy and gives an option for therapy besides expectant management.  - We discussed progesterone only options including - POP, Depo Provera and Lng-IUD.  We reviewed risks and benefits and proper use. Progesterone Only Birth Control Pills (POP)- The use of progesterone only birth control pills was discussed with the patient. The control of fibroid symptoms was discussed and the risks/benefits of therapy were discussed. The fact that this type of therapy does not contain estrogen was discussed with the patient.  Proper use was also discussed. We reviewed possible induction of amenorrhea with each options as well as the possibility of break through bleeding.  - We discussed the GnRH-agonists and antagonists. Reviewed both short term and long term impact of these medications. Reviewed short term impact of Depo Lupron (agonist) on bleeding.  - We discussed surgical/procedural options available: RFA (I.e. Sonata), [place], myomectomy and hysterectomy. We discussed the risks and benefits for each of these specific  procedures. For [place], recommended preop MRI and referral to interventional radiology.  For the sonata, reviewed that we would need to sign a special consent form for this procedure. We discussed the types and sizes of fibroids that are candidates for hysteroscopic resection of fibroids as well.  - Following counseling, the patient would like to {Blank multiple:19196::"***","Expectant management","TXA","POPs","Depo Provera","LnIUD","GnRH blocking medications","UAE","Sonata"}   2. DUB (dysfunctional uterine bleeding) Likely due to fibroid (submucosal).   3. Iron deficiency anemia secondary to inadequate dietary iron intake Continue iron supplementation as proving to be effective.     Diagnoses and all orders for this visit:  Fibroids, submucosal  DUB (dysfunctional uterine bleeding)  Iron deficiency anemia secondary to inadequate dietary iron intake    Routine preventative health maintenance measures emphasized. Please refer to After Visit Summary for other counseling recommendations.   No follow-ups on file.  Radene Gunning, MD, [place], [place]

## 2022-10-25 ENCOUNTER — Ambulatory Visit
Admission: EM | Admit: 2022-10-25 | Discharge: 2022-10-25 | Disposition: A | Discharge status: Home / Self Care | Payer: 59 | Attending: Family Medicine | Admitting: Family Medicine

## 2022-10-25 ENCOUNTER — Ambulatory Visit (INDEPENDENT_AMBULATORY_CARE_PROVIDER_SITE_OTHER): Payer: 59

## 2022-10-25 DIAGNOSIS — R0989 Other specified symptoms and signs involving the circulatory and respiratory systems: Secondary | ICD-10-CM | POA: Diagnosis not present

## 2022-10-25 DIAGNOSIS — J209 Acute bronchitis, unspecified: Secondary | ICD-10-CM | POA: Diagnosis not present

## 2022-10-25 DIAGNOSIS — R062 Wheezing: Secondary | ICD-10-CM

## 2022-10-25 DIAGNOSIS — J4541 Moderate persistent asthma with (acute) exacerbation: Secondary | ICD-10-CM

## 2022-10-25 DIAGNOSIS — R059 Cough, unspecified: Secondary | ICD-10-CM | POA: Diagnosis not present

## 2022-10-25 MED ORDER — AZITHROMYCIN 250 MG PO TABS: ORAL_TABLET | ORAL | 0 refills | Status: DC

## 2022-10-25 MED ORDER — PREDNISONE 20 MG PO TABS: 40.0000 mg | ORAL_TABLET | Freq: Every day | ORAL | 0 refills | Status: DC

## 2022-10-25 MED ORDER — METHYLPREDNISOLONE SODIUM SUCC 125 MG IJ SOLR
80.0000 mg | Freq: Once | INTRAMUSCULAR | Status: AC
  Administered 2022-10-25: 80 mg via INTRAMUSCULAR

## 2022-10-25 NOTE — ED Provider Notes (Signed)
[name] [facility]    CSN: [id number] Arrival date & time: 10/25/22  1031      History   Chief Complaint Chief Complaint  Patient presents with   Cough   Nasal Congestion   Wheezing    HPI [name] is a 46 y.o. female.   HPI Patient is on her sixth day of illness with cough congestion wheezing shortness of breath.  She states her chest has been feeling very tight.  She has been using albuterol inhaler and albuterol in her nebulizer.  She started herself on prednisone, as part of her asthma action plan from the pulmonologist.  She took 2 of the 10 mg tablets yesterday.  Feels no better today.  Is using over-the-counter cough medicine. Past Medical History:  Diagnosis Date   Allergy    Anemia    Asthma    Depression    Hyperlipidemia    Hypertension    Vaginal Pap smear, abnormal     Patient Active Problem List   Diagnosis Date Noted   Fibroids, submucosal 09/27/2022   DUB (dysfunctional uterine bleeding) 04/22/2021   Chronic cough 01/06/2020   Iron deficiency anemia 01/06/2020   Cigarette nicotine dependence without complication 60/73/7106   Attention deficit hyperactivity disorder (ADHD), combined type 04/07/2015   Allergic rhinitis 01/05/2014   GAD (generalized anxiety disorder) 01/05/2014   HTN (hypertension) 01/05/2014   Asthma 01/05/2009    Past Surgical History:  Procedure Laterality Date   CESAREAN SECTION     CESAREAN SECTION  04/10/2012   Procedure: CESAREAN SECTION;  Surgeon: Marylynn Pearson, MD;  Location: [facility];  Service: Gynecology;  Laterality: N/A;   COLPOSCOPY     LEEP     WISDOM TOOTH EXTRACTION      OB History     Gravida  6   Para  2   Term  2   Preterm      AB  4   Living  2      SAB  2   IAB  2   Ectopic      Multiple      Live Births  1            Home Medications    Prior to Admission medications   Medication Sig Start Date End Date Taking? Authorizing Provider  azithromycin (ZITHROMAX  Z-PAK) 250 MG tablet Take two pills today followed by one a day until gone 10/25/22  Yes Raylene Everts, MD  predniSONE (DELTASONE) 20 MG tablet Take 2 tablets (40 mg total) by mouth daily with breakfast. 10/25/22  Yes Raylene Everts, MD  acetaminophen (TYLENOL) 500 MG tablet Take 500-1,000 mg by mouth every 6 (six) hours as needed.    [provider]  albuterol (VENTOLIN HFA) 108 (90 Base) MCG/ACT inhaler Inhale 1-2 puffs into the lungs every 6 (six) hours as needed for wheezing or shortness of breath. 05/14/20   Kandra Nicolas, MD  amphetamine-dextroamphetamine (ADDERALL) 20 MG tablet Take 1 tablet (20 mg total) by mouth 2 (two) times daily. 11/17/22   Samuel Bouche, NP  amphetamine-dextroamphetamine (ADDERALL) 20 MG tablet Take 1 tablet (20 mg total) by mouth 2 (two) times daily. 10/18/22   Samuel Bouche, NP  amphetamine-dextroamphetamine (ADDERALL) 20 MG tablet Take 1 tablet (20 mg total) by mouth 2 (two) times daily. 12/17/22   Samuel Bouche, NP  Ascorbic Acid (VITAMIN C PO) Take 2 tablets by mouth daily at 6 (six) AM. Unsure dose - takes with  ferrous sulfate    [provider]  cyclobenzaprine (FLEXERIL) 10 MG tablet Take 10 mg by mouth at bedtime. 08/18/22   [provider]  docusate sodium (EQUATE STOOL SOFTENER) 100 MG capsule Take by mouth daily.    [provider]  ferrous sulfate 325 (65 FE) MG tablet Take 325 mg by mouth daily with breakfast.    [provider]  FLUoxetine (PROZAC) 20 MG capsule Take 1 capsule (20 mg total) by mouth daily. 05/29/22   Samuel Bouche, NP  fluticasone (FLONASE) 50 MCG/ACT nasal spray Place 1 spray into both nostrils daily. 05/04/20   [provider]  hydrochlorothiazide (HYDRODIURIL) 25 MG tablet Take 1 tablet (25 mg total) by mouth daily. 10/18/22   Samuel Bouche, NP  HYDROcodone-acetaminophen (NORCO/VICODIN) 5-325 MG tablet Take 1 tablet by mouth every 6 (six) hours as needed. 08/15/22   [provider]   hydrOXYzine (VISTARIL) 50 MG capsule Take 1 capsule (50 mg total) by mouth 3 (three) times daily as needed for anxiety (and sleep). 10/18/22   Samuel Bouche, NP  ibuprofen (ADVIL) 600 MG tablet Take 600 mg by mouth every 6 (six) hours as needed. 08/18/22   [provider]  ipratropium-albuterol (DUONEB) 0.5-2.5 (3) MG/3ML SOLN Take 3 mLs by nebulization 3 (three) times daily. 02/24/20   [provider]  loratadine (CLARITIN) 10 MG tablet Take 10 mg by mouth daily.    [provider]  montelukast (SINGULAIR) 10 MG tablet Take 10 mg by mouth daily. 10/06/22   [provider]  Multiple Vitamin (THERA) TABS Take 1 tablet by mouth daily.    [provider]  nicotine (NICODERM CQ - DOSED IN MG/24 HOURS) 21 mg/24hr patch Place 1 patch (21 mg total) onto the skin daily. 08/25/22   Samuel Bouche, NP  omeprazole (PRILOSEC) 40 MG capsule Take 40 mg by mouth daily. 02/24/20   [provider]  SYMBICORT 160-4.5 MCG/ACT inhaler Inhale 2 puffs into the lungs 2 (two) times daily. 08/16/22   [provider]  verapamil (VERELAN PM) 120 MG 24 hr capsule Take 1 capsule (120 mg total) by mouth at bedtime. 07/07/22   Samuel Bouche, NP  Fluticasone-Salmeterol (ADVAIR) 250-50 MCG/DOSE AEPB Inhale 1 puff into the lungs every 12 (twelve) hours. 05/14/20 10/17/20  Kandra Nicolas, MD  lisinopril (PRINIVIL,ZESTRIL) 40 MG tablet Take 40 mg by mouth daily.  10/17/20  [provider]    Family History Family History  Problem Relation Age of Onset   Hypertension Mother    Hypertension Father    Hypertension Maternal Grandmother     Social History Social History   Tobacco Use   Smoking status: Every Day    Packs/day: 0.50    Years: 20.00    Total pack years: 10.00    Types: Cigarettes   Smokeless tobacco: Never  Vaping Use   Vaping Use: Some days  Substance Use Topics   Alcohol use: No   Drug use: No     Allergies   Mosquito (diagnostic),  Amoxicillin, Keflex [cephalexin], Penicillins, and Lisinopril   Review of Systems Review of Systems See HPI  Physical Exam Triage Vital Signs ED Triage Vitals [10/25/22 1140]  Enc Vitals Group     BP 132/68     Pulse Rate 90     Resp 18     Temp 98.7 F (37.1 C)     Temp Source Oral     SpO2 97 %     Weight  Height      Head Circumference      Peak Flow      Pain Score 0     Pain Loc      Pain Edu?      Excl. in [place]?    No data found.  Updated Vital Signs BP 132/68 (BP Location: Right Arm)   Pulse 90   Temp 98.7 F (37.1 C) (Oral)   Resp 18   LMP 08/14/2022   SpO2 97%      Physical Exam Constitutional:      General: She is in acute distress.     Appearance: She is well-developed. She is ill-appearing.  HENT:     Head: Normocephalic and atraumatic.     Right Ear: Tympanic membrane and ear canal normal.     Left Ear: Tympanic membrane and ear canal normal.     Nose: No congestion or rhinorrhea.     Mouth/Throat:     Pharynx: No oropharyngeal exudate or posterior oropharyngeal erythema.  Eyes:     Conjunctiva/sclera: Conjunctivae normal.     Pupils: Pupils are equal, round, and reactive to light.  Cardiovascular:     Rate and Rhythm: Normal rate.  Pulmonary:     Effort: Pulmonary effort is normal. No respiratory distress.     Breath sounds: Wheezing and rhonchi present.  Abdominal:     General: There is no distension.     Palpations: Abdomen is soft.  Musculoskeletal:        General: Normal range of motion.     Cervical back: Normal range of motion.  Skin:    General: Skin is warm and dry.  Neurological:     Mental Status: She is alert.      UC Treatments / Results  Labs (all labs ordered are listed, but only abnormal results are displayed) Labs Reviewed - No data to display  EKG   Radiology DG Chest 2 View  Result Date: 10/25/2022 CLINICAL DATA:  Cough and rhonchi. Cough, congestion and wheezing. Chest tightness. EXAM: CHEST - 2  VIEW COMPARISON:  05/14/2020 FINDINGS: The heart is normal in size.The cardiomediastinal contours are normal. Mild bronchial thickening. Pulmonary vasculature is normal. No consolidation, pleural effusion, or pneumothorax. Slight scoliotic curvature of the spine. No acute osseous abnormalities are seen. IMPRESSION: Mild bronchial thickening, suggesting bronchitis or asthma. No pneumonia. Electronically Signed   By: Keith Rake M.D.   On: 10/25/2022 12:38    Procedures Procedures (including critical care time)  Medications Ordered in UC Medications  methylPREDNISolone sodium succinate (SOLU-MEDROL) 125 mg/2 mL injection 80 mg (80 mg Intramuscular Given 10/25/22 1159)    Initial Impression / Assessment and Plan / UC Course  I have reviewed the triage vital signs and the nursing notes.  Pertinent labs & imaging results that were available during my care of the patient were reviewed by me and considered in my medical decision making (see chart for details).     Final Clinical Impressions(s) / UC Diagnoses   Final diagnoses:  Acute bronchitis, unspecified organism  Moderate persistent asthma with exacerbation     Discharge Instructions      Continue with your Singulair, Symbicort, and albuterol inhalers Make sure you are drinking lots of fluids Run a humidifier Take antibiotic azithromycin as directed Take prednisone once a day      ED Prescriptions     Medication Sig Dispense Auth. Provider   azithromycin (ZITHROMAX Z-PAK) 250 MG tablet Take two pills today followed by  one a day until gone 6 tablet Raylene Everts, MD   predniSONE (DELTASONE) 20 MG tablet Take 2 tablets (40 mg total) by mouth daily with breakfast. 10 tablet Raylene Everts, MD      PDMP not reviewed this encounter.   Raylene Everts, MD 10/25/22 1447

## 2022-10-25 NOTE — Discharge Instructions (Signed)
Continue with your Singulair, Symbicort, and albuterol inhalers Make sure you are drinking lots of fluids Run a humidifier Take antibiotic azithromycin as directed Take prednisone once a day

## 2022-10-25 NOTE — ED Triage Notes (Signed)
Pt c/o cough, congestion and wheezing since Friday. Hoarse and chest tightness x 2 days. Yellow mucous. Denies fever. Hx of asthma. Nebulizer tx, inhaler and mucinex prn. Also has taken 2 days of prednisone that pulmonologist rx'd in Aug for asthma as needed.

## 2022-10-26 ENCOUNTER — Encounter: Payer: Self-pay | Admitting: Obstetrics and Gynecology

## 2022-10-26 ENCOUNTER — Ambulatory Visit (INDEPENDENT_AMBULATORY_CARE_PROVIDER_SITE_OTHER): Payer: 59 | Admitting: Obstetrics and Gynecology

## 2022-10-26 VITALS — BP 143/87 | HR 77 | Resp 16 | Ht 61.0 in | Wt 159.0 lb

## 2022-10-26 DIAGNOSIS — N938 Other specified abnormal uterine and vaginal bleeding: Secondary | ICD-10-CM | POA: Diagnosis not present

## 2022-10-26 DIAGNOSIS — D508 Other iron deficiency anemias: Secondary | ICD-10-CM | POA: Diagnosis not present

## 2022-10-26 DIAGNOSIS — D250 Submucous leiomyoma of uterus: Secondary | ICD-10-CM

## 2022-10-26 MED ORDER — TRANEXAMIC ACID 650 MG PO TABS: 1300.0000 mg | ORAL_TABLET | Freq: Three times a day (TID) | ORAL | 2 refills | Status: AC

## 2022-10-26 NOTE — Patient Instructions (Signed)
Surgery options: Hysteroscopic resection of fibroid Endometrial ablation Radiofrequency ablation [facility]) Hysterectomy (Total laparoscopic hysterectomy with salpingectomy and cystoscopy)

## 2022-11-03 ENCOUNTER — Encounter: Payer: Self-pay | Admitting: Medical-Surgical

## 2022-11-03 NOTE — Telephone Encounter (Signed)
Form printed, completed, and placed in providers box for signature.

## 2022-11-24 ENCOUNTER — Other Ambulatory Visit: Payer: Self-pay | Admitting: Medical-Surgical

## 2022-11-24 ENCOUNTER — Ambulatory Visit
Admission: RE | Admit: 2022-11-24 | Discharge: 2022-11-24 | Disposition: A | Discharge status: Home / Self Care | Payer: 59 | Source: Ambulatory Visit | Attending: Medical-Surgical | Admitting: Medical-Surgical

## 2022-11-24 DIAGNOSIS — N6320 Unspecified lump in the left breast, unspecified quadrant: Secondary | ICD-10-CM

## 2022-12-15 ENCOUNTER — Other Ambulatory Visit: Payer: Self-pay | Admitting: Medical-Surgical

## 2022-12-22 ENCOUNTER — Inpatient Hospital Stay: Payer: 59 | Attending: Hematology & Oncology

## 2022-12-22 ENCOUNTER — Inpatient Hospital Stay (HOSPITAL_BASED_OUTPATIENT_CLINIC_OR_DEPARTMENT_OTHER): Payer: 59 | Admitting: Family

## 2022-12-22 ENCOUNTER — Encounter: Payer: Self-pay | Admitting: Family

## 2022-12-22 VITALS — BP 131/83 | HR 77 | Temp 98.2°F | Resp 17 | Wt 159.1 lb

## 2022-12-22 DIAGNOSIS — D508 Other iron deficiency anemias: Secondary | ICD-10-CM

## 2022-12-22 DIAGNOSIS — D500 Iron deficiency anemia secondary to blood loss (chronic): Secondary | ICD-10-CM | POA: Diagnosis not present

## 2022-12-22 DIAGNOSIS — N920 Excessive and frequent menstruation with regular cycle: Secondary | ICD-10-CM | POA: Insufficient documentation

## 2022-12-22 LAB — CBC WITH DIFFERENTIAL (CANCER CENTER ONLY)
Abs Immature Granulocytes: 0.05 10*3/uL (ref 0.00–0.07)
Basophils Absolute: 0 10*3/uL (ref 0.0–0.1)
Basophils Relative: 1 %
Eosinophils Absolute: 0.1 10*3/uL (ref 0.0–0.5)
Eosinophils Relative: 3 %
HCT: 37.9 % (ref 36.0–46.0)
Hemoglobin: 12.7 g/dL (ref 12.0–15.0)
Immature Granulocytes: 1 %
Lymphocytes Relative: 28 %
Lymphs Abs: 1.5 10*3/uL (ref 0.7–4.0)
MCH: 30.8 pg (ref 26.0–34.0)
MCHC: 33.5 g/dL (ref 30.0–36.0)
MCV: 91.8 fL (ref 80.0–100.0)
Monocytes Absolute: 0.5 10*3/uL (ref 0.1–1.0)
Monocytes Relative: 10 %
Neutro Abs: 3 10*3/uL (ref 1.7–7.7)
Neutrophils Relative %: 57 %
Platelet Count: 236 10*3/uL (ref 150–400)
RBC: 4.13 MIL/uL (ref 3.87–5.11)
RDW: 11.9 % (ref 11.5–15.5)
WBC Count: 5.2 10*3/uL (ref 4.0–10.5)
nRBC: 0 % (ref 0.0–0.2)

## 2022-12-22 LAB — RETICULOCYTES
Immature Retic Fract: 11.5 % (ref 2.3–15.9)
RBC.: 4.12 MIL/uL (ref 3.87–5.11)
Retic Count, Absolute: 89 10*3/uL (ref 19.0–186.0)
Retic Ct Pct: 2.2 % (ref 0.4–3.1)

## 2022-12-22 LAB — IRON AND IRON BINDING CAPACITY (CC-WL,HP ONLY)
Iron: 115 ug/dL (ref 28–170)
Saturation Ratios: 31 % (ref 10.4–31.8)
TIBC: 371 ug/dL (ref 250–450)
UIBC: 256 ug/dL (ref 148–442)

## 2022-12-22 LAB — FERRITIN: Ferritin: 25 ng/mL (ref 11–307)

## 2022-12-22 NOTE — Progress Notes (Signed)
Hematology and Oncology Follow Up Visit  [name] [id number] 1975/12/12 47 y.o. 12/22/2022   Principle Diagnosis:  Iron deficiency anemia secondary to heavy cycles    Current Therapy:        IV iron as indicated    Interim History:  [name] is here today for follow-up. She is symptomatic with fatigue, mild SOB with exertion and occasional dizziness.  Her cycle is still regular with heavy flow. She was able to see her gynecologist and has been diagnosed with uterine fibroids. She has been given several treatment options including ablation and hysterectomy to consider.  No other blood loss noted. No bruising or petechiae.  No fever, chills, n/v, cough, rash, chest pain, palpitations, abdominal pain or changes in bowel or bladder habits.  She takes a stool softener as needed for constipation.  No swelling, tenderness, numbness or tingling in her extremities.  She notes some fluid retention with her cycle.  No falls or syncope reported.  Appetite and hydration are good. Weight is stable at 159 lbs.   ECOG Performance Status: 1 - Symptomatic but completely ambulatory  Medications:  Allergies as of 12/22/2022       Reactions   Mosquito (diagnostic) Swelling   Amoxicillin Hives   Causes SEVERE hives (patient reports that she needed epi-pen/prednisone after receiving it).    Keflex [cephalexin] Hives   Penicillins Hives   Lisinopril Cough        Medication List        Accurate as of December 22, 2022 10:27 AM. If you have any questions, ask your nurse or doctor.          acetaminophen 500 MG tablet Commonly known as: TYLENOL Take 500-1,000 mg by mouth every 6 (six) hours as needed.   albuterol 108 (90 Base) MCG/ACT inhaler Commonly known as: VENTOLIN HFA Inhale 1-2 puffs into the lungs every 6 (six) hours as needed for wheezing or shortness of breath.   amphetamine-dextroamphetamine 20 MG tablet Commonly known as: ADDERALL Take 1 tablet (20 mg total) by  mouth 2 (two) times daily.   amphetamine-dextroamphetamine 20 MG tablet Commonly known as: ADDERALL Take 1 tablet (20 mg total) by mouth 2 (two) times daily.   amphetamine-dextroamphetamine 20 MG tablet Commonly known as: ADDERALL Take 1 tablet (20 mg total) by mouth 2 (two) times daily.   azithromycin 250 MG tablet Commonly known as: Zithromax Z-Pak Take two pills today followed by one a day until gone   cyclobenzaprine 10 MG tablet Commonly known as: FLEXERIL Take 10 mg by mouth at bedtime.   EQUATE STOOL SOFTENER 100 MG capsule Generic drug: docusate sodium Take by mouth daily.   ferrous sulfate 325 (65 FE) MG tablet Take 325 mg by mouth daily with breakfast.   FLUoxetine 20 MG capsule Commonly known as: PROZAC Take 1 capsule (20 mg total) by mouth daily.   fluticasone 50 MCG/ACT nasal spray Commonly known as: FLONASE Place 1 spray into both nostrils daily.   hydrochlorothiazide 25 MG tablet Commonly known as: HYDRODIURIL Take 1 tablet (25 mg total) by mouth daily.   HYDROcodone-acetaminophen 5-325 MG tablet Commonly known as: NORCO/VICODIN Take 1 tablet by mouth every 6 (six) hours as needed.   hydrOXYzine 50 MG capsule Commonly known as: VISTARIL TAKE 1 CAPSULE BY MOUTH THREE TIMES DAILY AS NEEDED FOR ANXIETY AND FOR SLEEP   ibuprofen 600 MG tablet Commonly known as: ADVIL Take 600 mg by mouth every 6 (six) hours as needed.   ipratropium-albuterol  0.5-2.5 (3) MG/3ML Soln Commonly known as: DUONEB Take 3 mLs by nebulization 3 (three) times daily.   loratadine 10 MG tablet Commonly known as: CLARITIN Take 10 mg by mouth daily.   montelukast 10 MG tablet Commonly known as: SINGULAIR Take 10 mg by mouth daily.   nicotine 21 mg/24hr patch Commonly known as: NICODERM CQ - dosed in mg/24 hours Place 1 patch (21 mg total) onto the skin daily.   omeprazole 40 MG capsule Commonly known as: PRILOSEC Take 40 mg by mouth daily.   predniSONE 20 MG  tablet Commonly known as: DELTASONE Take 2 tablets (40 mg total) by mouth daily with breakfast.   Symbicort 160-4.5 MCG/ACT inhaler Generic drug: budesonide-formoterol Inhale 2 puffs into the lungs 2 (two) times daily.   Thera Tabs Take 1 tablet by mouth daily.   tranexamic acid 650 MG Tabs tablet Commonly known as: LYSTEDA Take 2 tablets (1,300 mg total) by mouth 3 (three) times daily. Take during menses for a maximum of five days   verapamil 120 MG 24 hr capsule Commonly known as: VERELAN PM Take 1 capsule (120 mg total) by mouth at bedtime.   VITAMIN C PO Take 2 tablets by mouth daily at 6 (six) AM. Unsure dose - takes with ferrous sulfate        Allergies:  Allergies  Allergen Reactions   Mosquito (Diagnostic) Swelling   Amoxicillin Hives    Causes SEVERE hives (patient reports that she needed epi-pen/prednisone after receiving it).    Keflex [Cephalexin] Hives   Penicillins Hives   Lisinopril Cough    Past Medical History, Surgical history, Social history, and Family History were reviewed and updated.  Review of Systems: All other 10 point review of systems is negative.   Physical Exam:  weight is 159 lb 1.9 oz (72.2 kg). Her oral temperature is 98.2 F (36.8 C). Her blood pressure is 131/83 and her pulse is 77. Her respiration is 17 and oxygen saturation is 99%.   Wt Readings from Last 3 Encounters:  12/22/22 159 lb 1.9 oz (72.2 kg)  10/26/22 159 lb (72.1 kg)  10/18/22 153 lb (69.4 kg)    Ocular: Sclerae unicteric, pupils equal, round and reactive to light Ear-nose-throat: Oropharynx clear, dentition fair Lymphatic: No cervical or supraclavicular adenopathy Lungs no rales or rhonchi, good excursion bilaterally Heart regular rate and rhythm, no murmur appreciated Abd soft, nontender, positive bowel sounds MSK no focal spinal tenderness, no joint edema Neuro: non-focal, well-oriented, appropriate affect Breasts: Deferred   Lab Results  Component  Value Date   WBC 5.2 12/22/2022   HGB 12.7 12/22/2022   HCT 37.9 12/22/2022   MCV 91.8 12/22/2022   PLT 236 12/22/2022   Lab Results  Component Value Date   FERRITIN 10 (L) 08/22/2022   IRON 92 08/22/2022   TIBC 405 08/22/2022   UIBC 313 08/22/2022   IRONPCTSAT 23 08/22/2022   Lab Results  Component Value Date   RETICCTPCT 2.2 12/22/2022   RBC 4.12 12/22/2022   No results found for: "KPAFRELGTCHN", "LAMBDASER", "KAPLAMBRATIO" No results found for: "IGGSERUM", "IGA", "IGMSERUM" No results found for: "TOTALPROTELP", "ALBUMINELP", "A1GS", "A2GS", "BETS", "BETA2SER", "GAMS", "MSPIKE", "SPEI"   Chemistry      Component Value Date/Time   NA 137 06/21/2022 1027   K 4.3 06/21/2022 1027   CL 100 06/21/2022 1027   CO2 28 06/21/2022 1027   BUN 10 06/21/2022 1027   CREATININE 0.82 06/21/2022 1027   CREATININE 0.59 01/27/2022 0000  Component Value Date/Time   CALCIUM 10.1 06/21/2022 1027   ALKPHOS 43 06/21/2022 1027   AST 15 06/21/2022 1027   ALT 11 06/21/2022 1027   BILITOT 0.6 06/21/2022 1027       Impression and Plan:  [name] is a very pleasant 47 yo caucasian female with long history of iron deficiency anemia.  Iron studies are pending. We will replace if needed.  Follow-up in 4 months.   Lottie Dawson, NP 1/26/202410:27 AM

## 2023-01-18 ENCOUNTER — Ambulatory Visit: Payer: 59 | Admitting: Medical-Surgical

## 2023-01-24 ENCOUNTER — Encounter: Payer: Self-pay | Admitting: Medical-Surgical

## 2023-01-24 ENCOUNTER — Ambulatory Visit (INDEPENDENT_AMBULATORY_CARE_PROVIDER_SITE_OTHER): Payer: 59 | Admitting: Medical-Surgical

## 2023-01-24 VITALS — BP 130/82 | HR 81 | Resp 20 | Ht 61.0 in | Wt 162.5 lb

## 2023-01-24 DIAGNOSIS — F902 Attention-deficit hyperactivity disorder, combined type: Secondary | ICD-10-CM | POA: Diagnosis not present

## 2023-01-24 DIAGNOSIS — F411 Generalized anxiety disorder: Secondary | ICD-10-CM

## 2023-01-24 DIAGNOSIS — I10 Essential (primary) hypertension: Secondary | ICD-10-CM

## 2023-01-24 MED ORDER — FLUOXETINE HCL 40 MG PO CAPS: 40.0000 mg | ORAL_CAPSULE | Freq: Every day | ORAL | 1 refills | Status: DC

## 2023-01-24 MED ORDER — AMPHETAMINE-DEXTROAMPHETAMINE 20 MG PO TABS: 20.0000 mg | ORAL_TABLET | Freq: Two times a day (BID) | ORAL | 0 refills | Status: DC

## 2023-01-24 MED ORDER — VERAPAMIL HCL ER 120 MG PO CP24: 120.0000 mg | ORAL_CAPSULE | Freq: Every day | ORAL | 1 refills | Status: DC

## 2023-01-24 NOTE — Progress Notes (Signed)
Established Patient Office Visit  Subjective   Patient ID: [name], female   DOB: 12-12-1975 Age: 47 y.o. MRN: [id number]   Chief Complaint  Patient presents with   ADHD   Follow-up   HPI Pleasant 47 year old female presenting today for follow up on:   Mood: has really been struggling lately. Found out at the beginning of the year that her employer is planning to globalize by the end of the year and her position is going to be in jeopardy. There are a lot of unknowns to the situation and she is not sure if she will retain her position or not. Has worked in this role for 19 years and has no desire to change anything. Finds herself irritable, withdrawn, and only able to focus on work. No pleasure in doing things and has started pulling away from normal family activities. Taking fluoxetine '20mg'$  daily, tolerating well without side effects. Felt that the medication was working up until this recent issue. Denies SI/HI.   ADHD: taking Adderall '20mg'$  twice daily, tolerating well without side effects. No change in sleep, weight, or appetite. Feels that her focus has been all over the place lately.    HTN: taking Verapamil '120mg'$  daily, tolerating well without side effects. Thought her BP would be higher with all of her stress. Denies CP, SOB, palpitations, lower extremity edema, dizziness, headaches, or vision changes.  Objective:    Vitals:   01/24/23 1031  BP: 130/82  Pulse: 81  Resp: 20  Height: '5\' 1"'$  (1.549 m)  Weight: 162 lb 8 oz (73.7 kg)  SpO2: 98%  BMI (Calculated): 30.72    Physical Exam Vitals reviewed.  Constitutional:      General: She is not in acute distress.    Appearance: Normal appearance. She is not ill-appearing.  HENT:     Head: Normocephalic and atraumatic.  Cardiovascular:     Rate and Rhythm: Normal rate and regular rhythm.     Pulses: Normal pulses.     Heart sounds: Normal heart sounds.  Pulmonary:     Effort: Pulmonary effort is normal. No  respiratory distress.     Breath sounds: Normal breath sounds. No wheezing, rhonchi or rales.  Skin:    General: Skin is warm and dry.  Neurological:     Mental Status: She is alert and oriented to person, place, and time.  Psychiatric:        Mood and Affect: Mood normal.        Behavior: Behavior normal.        Thought Content: Thought content normal.        Judgment: Judgment normal.   No results found for this or any previous visit (from the past 24 hour(s)).     The 10-year ASCVD risk score (Arnett DK, et al., 2019) is: 1.8%   Values used to calculate the score:     Age: 20 years     Sex: Female     Is Non-Hispanic African American: No     Diabetic: No     Tobacco smoker: Yes     Systolic Blood Pressure: [id number]     Is BP treated: Yes     HDL Cholesterol: 84 mg/dL     Total Cholesterol: 198 mg/dL   Assessment & Plan:   1. Attention deficit hyperactivity disorder (ADHD), combined type Continue Adderall '20mg'$  BID. Feel that her recent exacerbation of anxiety is affecting focus. Would like to get her mood under better control  then reevaluate her Adderall regimen.  - amphetamine-dextroamphetamine (ADDERALL) 20 MG tablet; Take 1 tablet (20 mg total) by mouth 2 (two) times daily.  Dispense: 60 tablet; Refill: 0 - amphetamine-dextroamphetamine (ADDERALL) 20 MG tablet; Take 1 tablet (20 mg total) by mouth 2 (two) times daily.  Dispense: 60 tablet; Refill: 0 - amphetamine-dextroamphetamine (ADDERALL) 20 MG tablet; Take 1 tablet (20 mg total) by mouth 2 (two) times daily.  Dispense: 60 tablet; Refill: 0  2. Primary hypertension Refilling Verapamil. BP at goal.  - verapamil (VERELAN PM) 120 MG 24 hr capsule; Take 1 capsule (120 mg total) by mouth at bedtime.  Dispense: 90 capsule; Refill: 1  3. GAD (generalized anxiety disorder) Increasing fluoxetine to '40mg'$  daily. Discussed situational factors can be difficult to manage even when on a medication. Recommend working to stay well  informed and maker sure to take opportunities for updates from higher ups.  - FLUoxetine (PROZAC) 40 MG capsule; Take 1 capsule (40 mg total) by mouth daily.  Dispense: 90 capsule; Refill: 1  Return in about 4 weeks (around 02/21/2023) for mood follow up.  ___________________________________________ Clearnce Sorrel, DNP, APRN, FNP-BC Primary Care and [place]

## 2023-02-21 ENCOUNTER — Ambulatory Visit (INDEPENDENT_AMBULATORY_CARE_PROVIDER_SITE_OTHER): Payer: 59 | Admitting: Medical-Surgical

## 2023-02-21 VITALS — BP 130/83 | HR 71 | Resp 20 | Ht 61.0 in | Wt 170.5 lb

## 2023-02-21 DIAGNOSIS — F411 Generalized anxiety disorder: Secondary | ICD-10-CM

## 2023-02-21 DIAGNOSIS — F902 Attention-deficit hyperactivity disorder, combined type: Secondary | ICD-10-CM

## 2023-02-21 DIAGNOSIS — I10 Essential (primary) hypertension: Secondary | ICD-10-CM | POA: Diagnosis not present

## 2023-02-21 MED ORDER — FLUOXETINE HCL 20 MG PO CAPS: 20.0000 mg | ORAL_CAPSULE | Freq: Every day | ORAL | 3 refills | Status: DC

## 2023-02-21 MED ORDER — CYCLOBENZAPRINE HCL 10 MG PO TABS: 5.0000 mg | ORAL_TABLET | Freq: Three times a day (TID) | ORAL | 1 refills | Status: DC | PRN

## 2023-02-21 MED ORDER — LISDEXAMFETAMINE DIMESYLATE 40 MG PO CAPS: 40.0000 mg | ORAL_CAPSULE | ORAL | 0 refills | Status: DC

## 2023-02-21 NOTE — Progress Notes (Signed)
        Established patient visit  History, exam, impression, and plan:  HTN (hypertension) Blood pressure elevated on arrival however recheck at 130/83.  She is doing well on hydrochlorothiazide 25 mg daily and verapamil 120 mg daily.  Feel that stress and anxiety is exacerbating the blood pressure at this point.  Would like to get her mood under better control then reevaluate before making any medication changes.  Continue to recommend low-sodium diet, monitoring at home with a goal of 130/80 or less, regular intentional exercise, and maintenance of a healthy weight.  Attention deficit hyperactivity disorder (ADHD), combined type Has been on Adderall 20 mg twice daily for over 6 months and feels that the medication does work well for her focus however she has noted that she is increasingly irritable and easily angered recently.  Unclear if this is related to the Adderall or her mood concerns.  She does have significant issues with appetite, binge eating, and cravings.  Discussed various options for management of ADHD and medications that may benefit her.  In this setting, we did discuss switching over to Vyvanse to see if this would be more beneficial to help with weight maintenance and curbing her appetite.  She would like to give this a try so Vyvanse 40 mg daily sent to the pharmacy.  GAD (generalized anxiety disorder) Not at goal.  Unfortunately she has seen no benefit from increasing fluoxetine to 40 mg over the last 4 weeks.  Discussed continued symptoms of emotional lability, irritability, easily angered, and overall stress.  She has multiple factors that could be contributing to this at this point.  Significantly concerned over her weight and her eating habits and is very anxious regarding weight gain.  Discussed various approaches.  She is not currently doing counseling and honestly feels that she does not have time for this.  After review, advised some conservative measures that she can do at  home including full disclosure discussion with her spouse, finding exercise activities that help bond with the family, and making sure to take time for herself.  Will try increasing fluoxetine to 60 mg daily over the next 4 weeks to see if this is beneficial.  If not, we may need to switch to a different agent or add a secondary agent for better management.  Procedures performed this visit: None.  Return in about 4 weeks (around 03/21/2023) for ADHD/mood follow up.  __________________________________ Katie Sorrel, DNP, APRN, FNP-BC Primary Care and [place]

## 2023-02-21 NOTE — Assessment & Plan Note (Signed)
Has been on Adderall 20 mg twice daily for over 6 months and feels that the medication does work well for her focus however she has noted that she is increasingly irritable and easily angered recently.  Unclear if this is related to the Adderall or her mood concerns.  She does have significant issues with appetite, binge eating, and cravings.  Discussed various options for management of ADHD and medications that may benefit her.  In this setting, we did discuss switching over to Vyvanse to see if this would be more beneficial to help with weight maintenance and curbing her appetite.  She would like to give this a try so Vyvanse 40 mg daily sent to the pharmacy.

## 2023-02-21 NOTE — Assessment & Plan Note (Signed)
Blood pressure elevated on arrival however recheck at 130/83.  She is doing well on hydrochlorothiazide 25 mg daily and verapamil 120 mg daily.  Feel that stress and anxiety is exacerbating the blood pressure at this point.  Would like to get her mood under better control then reevaluate before making any medication changes.  Continue to recommend low-sodium diet, monitoring at home with a goal of 130/80 or less, regular intentional exercise, and maintenance of a healthy weight.

## 2023-02-21 NOTE — Assessment & Plan Note (Signed)
Not at goal.  Unfortunately she has seen no benefit from increasing fluoxetine to 40 mg over the last 4 weeks.  Discussed continued symptoms of emotional lability, irritability, easily angered, and overall stress.  She has multiple factors that could be contributing to this at this point.  Significantly concerned over her weight and her eating habits and is very anxious regarding weight gain.  Discussed various approaches.  She is not currently doing counseling and honestly feels that she does not have time for this.  After review, advised some conservative measures that she can do at home including full disclosure discussion with her spouse, finding exercise activities that help bond with the family, and making sure to take time for herself.  Will try increasing fluoxetine to 60 mg daily over the next 4 weeks to see if this is beneficial.  If not, we may need to switch to a different agent or add a secondary agent for better management.

## 2023-03-04 ENCOUNTER — Other Ambulatory Visit: Payer: Self-pay | Admitting: Medical-Surgical

## 2023-03-12 ENCOUNTER — Encounter: Payer: Self-pay | Admitting: Medical-Surgical

## 2023-03-16 IMAGING — MG MM DIGITAL SCREENING BILAT W/ TOMO AND CAD
8 series · 8 of 24 positions shown · non-contrast
Comparison: None.

CLINICAL DATA: Screening.

EXAM:
DIGITAL SCREENING BILATERAL MAMMOGRAM WITH TOMOSYNTHESIS AND CAD
TECHNIQUE: Bilateral screening digital craniocaudal and mediolateral oblique
mammograms were obtained. Bilateral screening digital breast
tomosynthesis was performed. The images were evaluated with
computer-aided detection.

[L CC synth-2D]
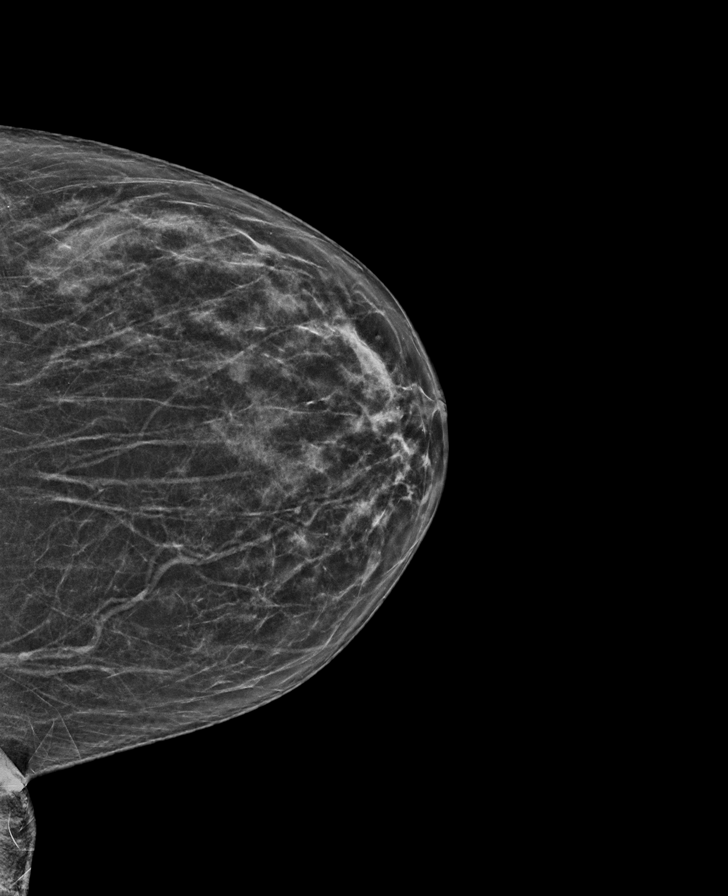

[R CC synth-2D]
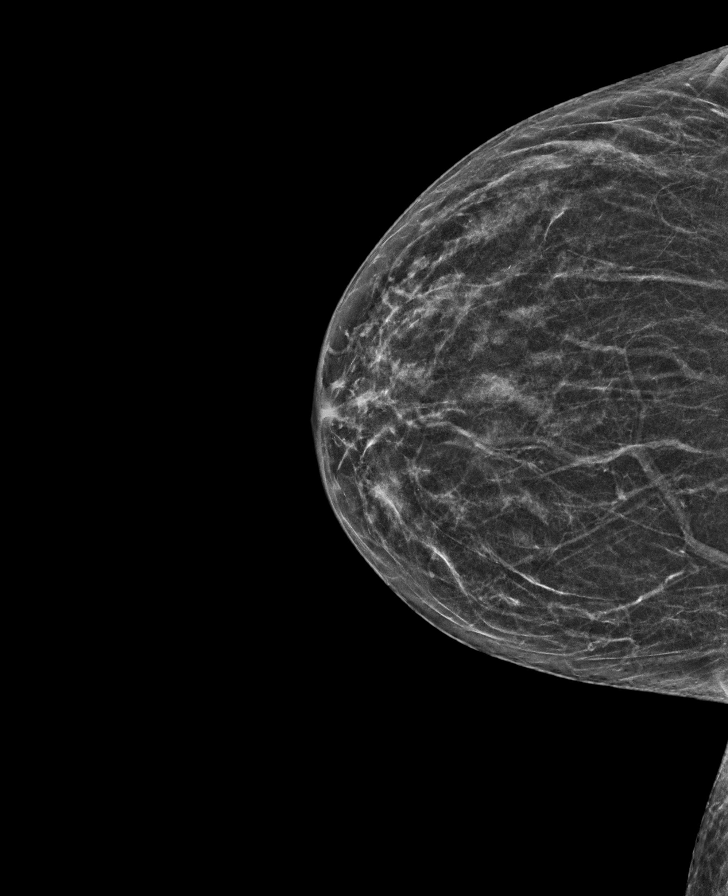

[R MLO synth-2D]
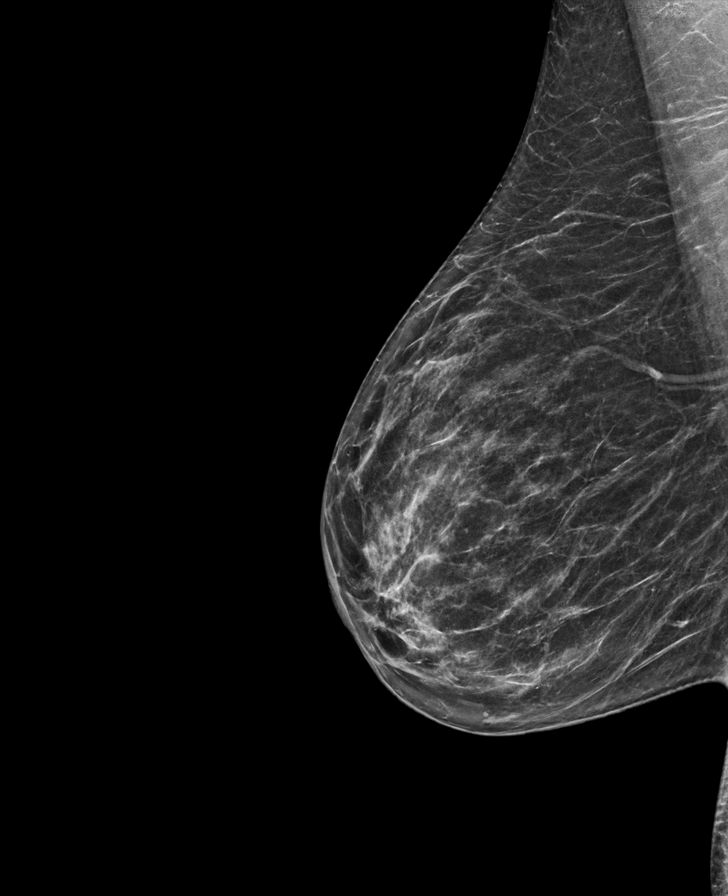

[L MLO synth-2D]
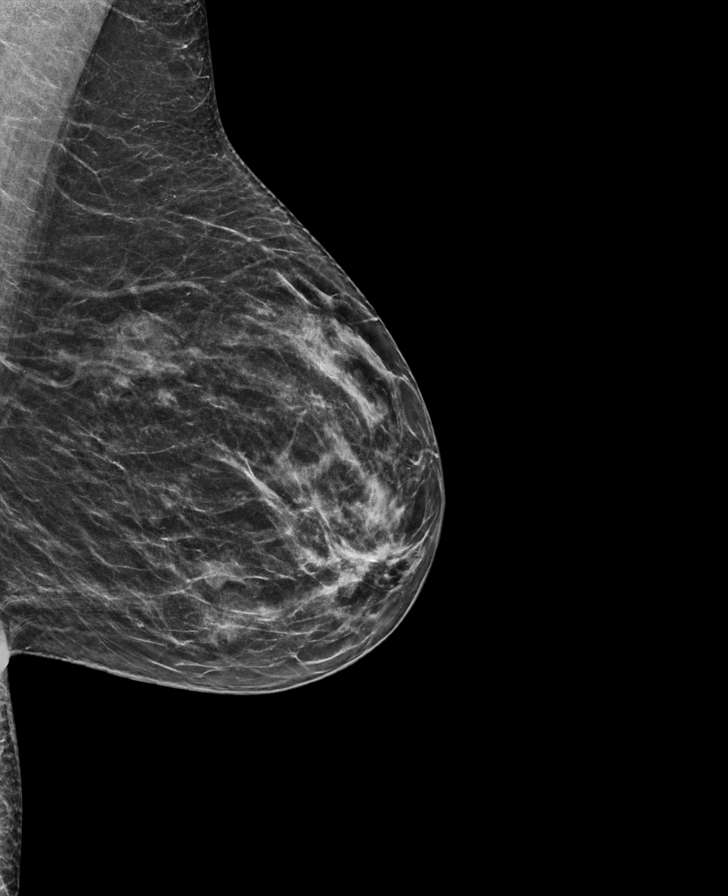

[R CC tomo · tomo slice 27/54.0]
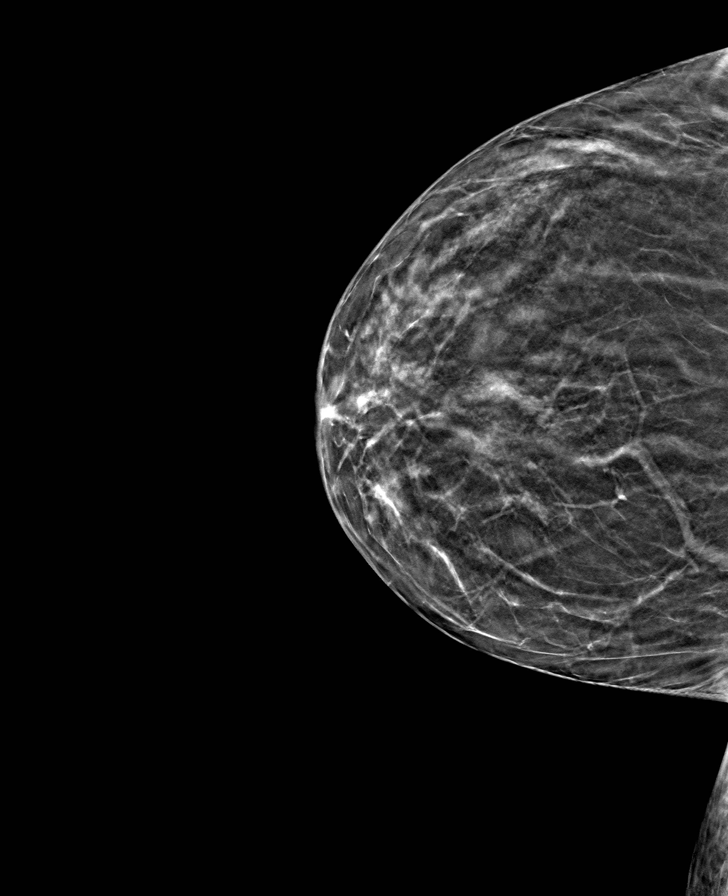

[R MLO tomo · tomo slice 33/64.0]
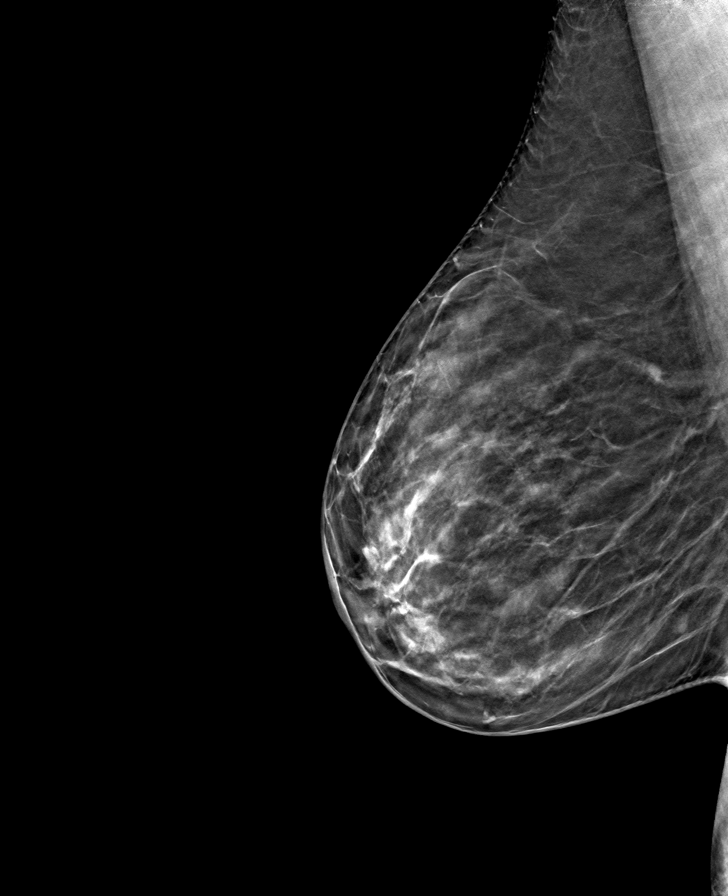

[L CC tomo · tomo slice 29/58.0]
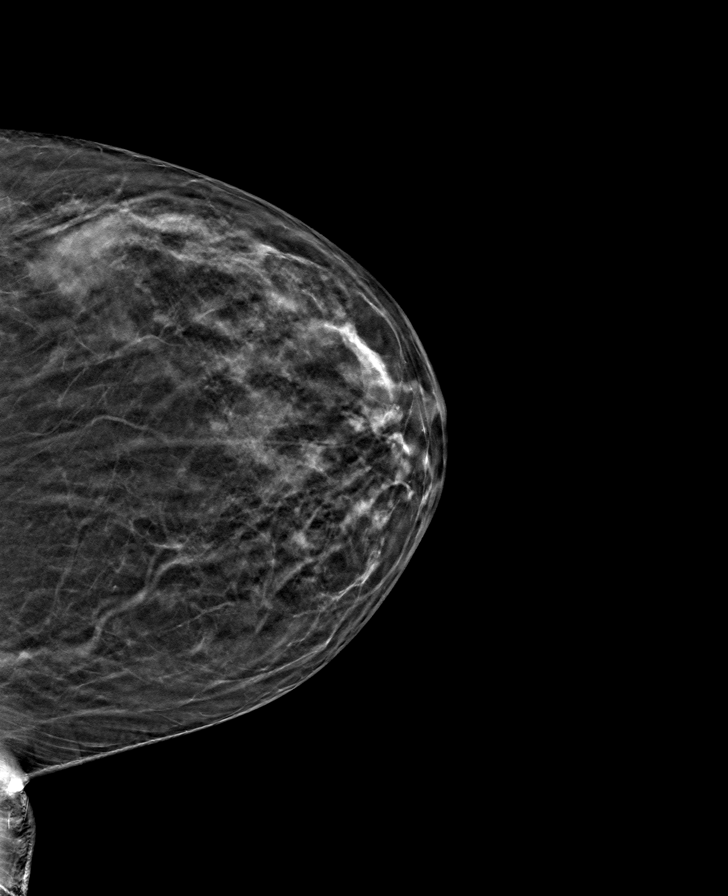

[L MLO tomo · tomo slice 32/63.0]
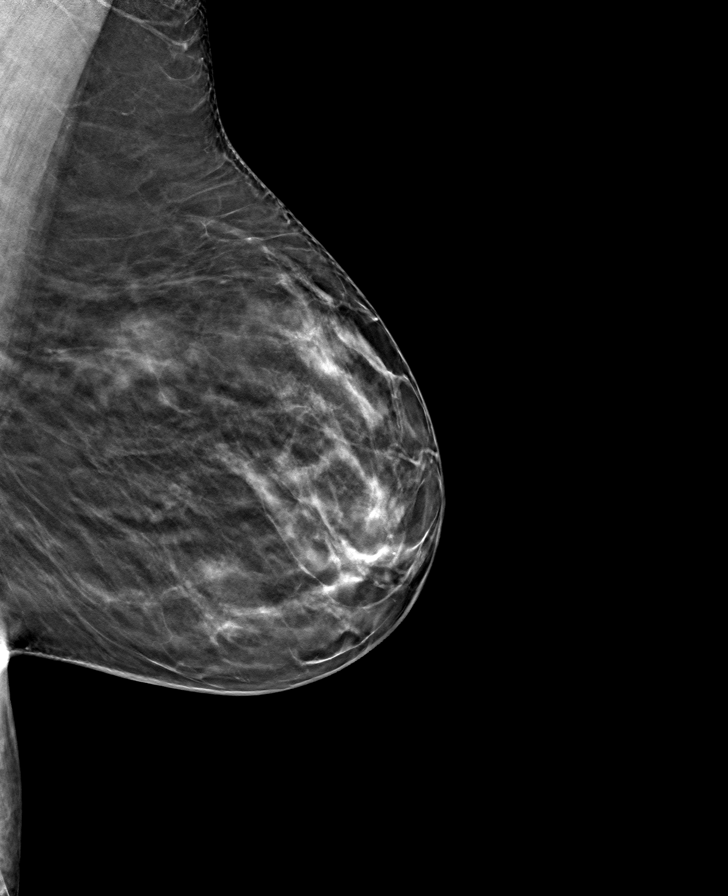

[8 of 24 positions shown; findings below may reference images not displayed]

ACR Breast Density Category b: There are scattered areas of
fibroglandular density.
FINDINGS: In the left breast, a possible mass warrants further evaluation. In
the right breast, no findings suspicious for malignancy.
IMPRESSION: Further evaluation is suggested for a possible mass in the left
breast.

RECOMMENDATION:
Diagnostic mammogram and possibly ultrasound of the left breast.
(Code:[id number])

The patient will be contacted regarding the findings, and additional
imaging will be scheduled.

BI-RADS CATEGORY  0: Incomplete. Need additional imaging evaluation
and/or prior mammograms for comparison.

## 2023-03-21 ENCOUNTER — Ambulatory Visit: Payer: 59 | Admitting: Medical-Surgical

## 2023-03-26 MED ORDER — LISDEXAMFETAMINE DIMESYLATE 30 MG PO CAPS: 30.0000 mg | ORAL_CAPSULE | Freq: Every day | ORAL | 0 refills | Status: DC

## 2023-03-27 NOTE — Telephone Encounter (Signed)
Cancelled Vyvanse 40 mg.

## 2023-04-18 ENCOUNTER — Ambulatory Visit: Payer: 59 | Admitting: Medical-Surgical

## 2023-04-20 ENCOUNTER — Inpatient Hospital Stay: Payer: 59

## 2023-04-20 ENCOUNTER — Ambulatory Visit: Payer: 59 | Admitting: Family

## 2023-04-25 ENCOUNTER — Encounter: Payer: Self-pay | Admitting: Medical-Surgical

## 2023-04-25 ENCOUNTER — Ambulatory Visit (INDEPENDENT_AMBULATORY_CARE_PROVIDER_SITE_OTHER): Payer: 59 | Admitting: Medical-Surgical

## 2023-04-25 VITALS — BP 150/92 | HR 73 | Resp 20 | Ht 61.0 in | Wt 170.4 lb

## 2023-04-25 DIAGNOSIS — I10 Essential (primary) hypertension: Secondary | ICD-10-CM | POA: Diagnosis not present

## 2023-04-25 DIAGNOSIS — F902 Attention-deficit hyperactivity disorder, combined type: Secondary | ICD-10-CM

## 2023-04-25 MED ORDER — LISDEXAMFETAMINE DIMESYLATE 50 MG PO CAPS: 50.0000 mg | ORAL_CAPSULE | Freq: Every day | ORAL | 0 refills | Status: DC

## 2023-04-25 NOTE — Progress Notes (Signed)
        Established patient visit  History, exam, impression, and plan:  1. Attention deficit hyperactivity disorder (ADHD), combined type Pleasant 47 year old female presenting today for follow-up on ADHD.  She was switched from instant release Adderall over to Vyvanse and has been taking 30 mg daily, tolerating well without side effects.  Unfortunately, she feels that the medication is not helping very much and she has been struggling over the last month to get things done.  Endorses difficulty with task completion and a tendency to freeze when feeling overwhelmed.  No change in appetite, sleep patterns, or unexpected weight fluctuations.  Increase Vyvanse to 50 mg daily.  She still has instant release Adderall to use in the afternoon if needed.  Recommend trying Vyvanse for a week or so at the higher dose and if still feeling the afternoon slump around 2:00, okay to use Adderall 10-20 mg IR to see if this gets her through the day and enables her to complete things in a timely manner without additional stress.  2. Primary hypertension History of hypertension currently managed with HCTZ 25 mg and verapamil 120 mg daily.  Tolerating both medications well without side effects.  Has a blood pressure cuff at home but does not regularly monitor this.  Blood pressure elevated on arrival today and again on recheck.  Unclear if this is related to an elevation in stress and frustration.  Would like to have ambulatory monitoring at home with a goal of 130/80 or less at least 3 times weekly over the next 2 weeks.  Plan to send her a MyChart message to see if her blood pressure is still running higher than goal and if so, adjust her medication doses at that time.  Denies CP, SOB, LE edema, HA, palpitations, dizziness, and abrupt vision changes.  HRRR, S1/S2 normal.  Lungs CTA.  Respirations even unlabored.  No peripheral edema.  Continue HCTZ and verapamil as prescribed.  Procedures performed this  visit: None.  Return in about 4 weeks (around 05/23/2023) for ADHD follow up.  __________________________________ Thayer Ohm, DNP, APRN, FNP-BC Primary Care and Sports Medicine [facility] [place]

## 2023-05-09 ENCOUNTER — Encounter: Payer: Self-pay | Admitting: Medical-Surgical

## 2023-05-09 DIAGNOSIS — I10 Essential (primary) hypertension: Secondary | ICD-10-CM

## 2023-05-23 ENCOUNTER — Other Ambulatory Visit: Payer: 59

## 2023-05-23 MED ORDER — TRIAMTERENE-HCTZ 37.5-25 MG PO TABS: 1.0000 | ORAL_TABLET | Freq: Every day | ORAL | 3 refills | Status: DC

## 2023-05-23 MED ORDER — LISDEXAMFETAMINE DIMESYLATE 50 MG PO CAPS: 50.0000 mg | ORAL_CAPSULE | Freq: Every day | ORAL | 0 refills | Status: DC

## 2023-05-24 ENCOUNTER — Ambulatory Visit: Payer: 59 | Admitting: Medical-Surgical

## 2023-05-25 MED ORDER — LISDEXAMFETAMINE DIMESYLATE 50 MG PO CAPS: 50.0000 mg | ORAL_CAPSULE | Freq: Every day | ORAL | 0 refills | Status: DC

## 2023-05-25 NOTE — Telephone Encounter (Signed)
Please contact [facility] and cancel the prescription for Vyvanse 50 mg that was sent over on 05/23/2023.  The prescription has now been sent to [facility] as requested.

## 2023-05-25 NOTE — Telephone Encounter (Signed)
Pended prescription and pharmacy.

## 2023-05-25 NOTE — Addendum Note (Signed)
Addended by: Elizabeth Palau on: 05/25/2023 08:40 AM   Modules accepted: Orders

## 2023-05-29 NOTE — Telephone Encounter (Signed)
Task completed. Per provider's request, vyvanse rx cancelled at [facility] pharmacy (spoke to [place]).

## 2023-06-04 ENCOUNTER — Other Ambulatory Visit: Payer: Self-pay | Admitting: Medical-Surgical

## 2023-06-05 ENCOUNTER — Other Ambulatory Visit: Payer: 59

## 2023-06-06 ENCOUNTER — Encounter: Payer: Self-pay | Admitting: Medical-Surgical

## 2023-06-06 ENCOUNTER — Ambulatory Visit (INDEPENDENT_AMBULATORY_CARE_PROVIDER_SITE_OTHER): Payer: 59 | Admitting: Medical-Surgical

## 2023-06-06 VITALS — BP 146/93 | HR 71 | Ht 61.0 in | Wt 168.0 lb

## 2023-06-06 DIAGNOSIS — F902 Attention-deficit hyperactivity disorder, combined type: Secondary | ICD-10-CM

## 2023-06-06 DIAGNOSIS — I10 Essential (primary) hypertension: Secondary | ICD-10-CM

## 2023-06-06 DIAGNOSIS — L232 Allergic contact dermatitis due to cosmetics: Secondary | ICD-10-CM | POA: Diagnosis not present

## 2023-06-06 MED ORDER — LISDEXAMFETAMINE DIMESYLATE 50 MG PO CAPS: 50.0000 mg | ORAL_CAPSULE | Freq: Every day | ORAL | 0 refills | Status: DC

## 2023-06-06 MED ORDER — HYDROCORTISONE 2.5 % EX OINT: TOPICAL_OINTMENT | CUTANEOUS | 3 refills | Status: AC

## 2023-06-06 NOTE — Progress Notes (Signed)
        Established patient visit  History, exam, impression, and plan:  1. Attention deficit hyperactivity disorder (ADHD), combined type [name] 47 year old female presenting today for follow-up on ADHD.  She has been taking Vyvanse 50 mg daily, tolerating well without side effects.  Feels that the medication is helping better at the 50 mg dose although she continues to have significant issues with focus, task completion, and feeling overwhelmed when at work.  Also notes that her appetite is better managed with the increased dose of Vyvanse.  Continues to have some difficulty sleeping but does not feel this is medication related.  No unusual weight fluctuations.  She does have significant difficulty with anxiety/depression symptoms and we discussed the relationship between that and focus.  I feel that a lot of her stressors are work related and finding a better system for time management and organization would be very beneficial.  We discussed various options to help including use of an Solicitor such as Event organiser, looking into time management and organizational options at her employer, speaking directly with her manager regarding work accommodations, and looking for support groups for patients with ADHD.  Plan to continue Vyvanse at 50 mg daily for now.  We will reevaluate in 3 months to see how her symptoms are doing with incorporation of the above lifestyle/work modifications.  2. Allergic contact dermatitis due to cosmetics Today, she reports that she has some redness, irritation, and burning along both sides of her nose and on her neck.  Used an acne facial wash last night while showing her teenager how it supposed to be used and the irritation and redness appeared this morning.  Symptoms consistent with contact dermatitis.  Adding hydrocortisone 2.5% cream topically twice daily to the affected area for up to 14 days.  Reviewed avoidance of overuse and prolonged use to prevent skin discoloration  and thinning.  3.  Primary hypertension Currently taking verapamil 120 mg nightly along with triamterene-hydrochlorothiazide 37.5-25 mg daily.  Tolerating both medications well without side effects.  Has blood pressure cuff at home but notes the batteries are dead and she has not replaced it so she has not been checking.  Following a low-sodium diet and has cut out most processed foods.  Denies concerning symptoms today.  Cardiopulmonary exam normal.  No peripheral edema.  Respirations even and unlabored with lungs CTA.  Blood pressure on arrival significantly elevated at 154/103.  Recheck in the 140s/90s.  Advised that blood pressure is not at goal however she is likely seeing elevations in response to significant anxiety.  Recommend she start monitoring blood pressure at home with a goal of 130/80 or less.  I will send her a MyChart message in 2 weeks to touch base and see how her readings are going before we change medications.  Patient verbalized understanding and is agreeable to the plan.   Procedures performed this visit: None.  Return in about 3 months (around 09/06/2023) for ADHD follow up.  __________________________________ Thayer Ohm, DNP, APRN, FNP-BC Primary Care and Sports Medicine [facility] [place]

## 2023-06-12 ENCOUNTER — Ambulatory Visit
Admission: RE | Admit: 2023-06-12 | Discharge: 2023-06-12 | Disposition: A | Discharge status: Home / Self Care | Payer: 59 | Source: Ambulatory Visit | Attending: Medical-Surgical | Admitting: Medical-Surgical

## 2023-06-12 ENCOUNTER — Other Ambulatory Visit: Payer: Self-pay | Admitting: Medical-Surgical

## 2023-06-12 DIAGNOSIS — N6323 Unspecified lump in the left breast, lower outer quadrant: Secondary | ICD-10-CM

## 2023-06-12 DIAGNOSIS — N6320 Unspecified lump in the left breast, unspecified quadrant: Secondary | ICD-10-CM

## 2023-06-12 MED ORDER — LISDEXAMFETAMINE DIMESYLATE 50 MG PO CAPS: 50.0000 mg | ORAL_CAPSULE | Freq: Every day | ORAL | 0 refills | Status: DC

## 2023-06-13 NOTE — Telephone Encounter (Signed)
Cancelled the prescriptions.

## 2023-06-21 ENCOUNTER — Other Ambulatory Visit: Payer: Self-pay | Admitting: Medical-Surgical

## 2023-06-21 MED ORDER — LISDEXAMFETAMINE DIMESYLATE 50 MG PO CAPS: 50.0000 mg | ORAL_CAPSULE | Freq: Every day | ORAL | 0 refills | Status: DC

## 2023-06-21 MED ORDER — AMPHETAMINE-DEXTROAMPHETAMINE 20 MG PO TABS: 10.0000 mg | ORAL_TABLET | Freq: Every day | ORAL | 0 refills | Status: DC

## 2023-07-03 ENCOUNTER — Ambulatory Visit: Admission: RE | Admit: 2023-07-03 | Payer: 59 | Source: Ambulatory Visit

## 2023-07-03 DIAGNOSIS — N6323 Unspecified lump in the left breast, lower outer quadrant: Secondary | ICD-10-CM

## 2023-07-03 HISTORY — PX: BREAST BIOPSY: SHX20

## 2023-08-01 ENCOUNTER — Encounter: Payer: Self-pay | Admitting: Medical-Surgical

## 2023-08-01 ENCOUNTER — Ambulatory Visit (INDEPENDENT_AMBULATORY_CARE_PROVIDER_SITE_OTHER): Payer: 59 | Admitting: Medical-Surgical

## 2023-08-01 VITALS — BP 131/84 | HR 74 | Ht 61.0 in | Wt 177.0 lb

## 2023-08-01 DIAGNOSIS — F902 Attention-deficit hyperactivity disorder, combined type: Secondary | ICD-10-CM | POA: Diagnosis not present

## 2023-08-01 DIAGNOSIS — D508 Other iron deficiency anemias: Secondary | ICD-10-CM

## 2023-08-01 DIAGNOSIS — F411 Generalized anxiety disorder: Secondary | ICD-10-CM | POA: Diagnosis not present

## 2023-08-01 DIAGNOSIS — R011 Cardiac murmur, unspecified: Secondary | ICD-10-CM | POA: Diagnosis not present

## 2023-08-01 DIAGNOSIS — I10 Essential (primary) hypertension: Secondary | ICD-10-CM | POA: Diagnosis not present

## 2023-08-01 DIAGNOSIS — R635 Abnormal weight gain: Secondary | ICD-10-CM

## 2023-08-01 DIAGNOSIS — J4520 Mild intermittent asthma, uncomplicated: Secondary | ICD-10-CM

## 2023-08-01 DIAGNOSIS — K5900 Constipation, unspecified: Secondary | ICD-10-CM

## 2023-08-01 MED ORDER — GABAPENTIN 100 MG PO CAPS: 100.0000 mg | ORAL_CAPSULE | Freq: Every day | ORAL | 1 refills | Status: DC

## 2023-08-01 NOTE — Progress Notes (Signed)
Established patient visit  History, exam, impression, and plan:  1. Primary hypertension Pleasant 47 year old female presenting today with a history of HTN currently treated with Maxide 37.5-25mg  daily and verapamil 120 mg daily. Tolerating the medication well without side effects.  Working to follow a low-sodium diet but notes that she has been gaining some weight.  Not as active as she would like recently.  Blood pressure is borderline today but overall okay.  Checking labs as below.  Continue Maxide and verapamil as prescribed. - CBC with Differential/Platelet - CMP14+EGFR - Lipid panel  2. Systolic murmur On exam, new systolic murmur identified today.  Notes a history of being told she had a murmur however the next provider told her she did not. Grade 2/6 systolic murmur noted best heard at the right upper sternal border. HRR. S2 normal.  Possible S3 heart sound noted at the left upper sternal border but consider possible referred noise from noted murmur.  No significant edema today but patient reports she does feel puffy.  Plan to check labs as above and adding BNP.  In office EKG completed showing normal sinus rhythm, normal axis, possible left atrial enlargement and prolonged QT.  Echocardiogram for further evaluation today. - B Nat Peptide - ECHOCARDIOGRAM COMPLETE; Future  3. Attention deficit hyperactivity disorder (ADHD), combined type Unfortunately, has been unable to obtain Vyvanse 50 mg daily due to national backorder.  Has Adderall 20 mg to use in the afternoon that she has been relying on in the meantime.  She has found the medication at a nearby pharmacy however the cost is not financially feasible using Good[facility].  Prior authorization form submitted to see if this would help with insurance coverage however her insurance already has this medication on formulary.  Contacted the pharmacy on file and they reported that they are not in the insurance network coverage and that she  would need to use a different pharmacy.  MyChart message sent to patient with this information and instructions to contact her insurance to see what pharmacy is preferred so we can resend the prescriptions.  Referring to behavioral health for counseling to better manage ADHD as well as anxiety as below. - Ambulatory referral to Behavioral Health  4. GAD (generalized anxiety disorder) Currently taking fluoxetine 60 mg daily.  Has been struggling quite a bit lately with getting decent rest, frequent waking at night, stress with job changes, and overall feeling unhealthy.  After discussion, would like to get her in for counseling with behavioral health as I feel this would be very beneficial.  Patient agrees so referral placed. - Ambulatory referral to Behavioral Health  5. Mild intermittent asthma without complication No current concerns with asthma symptoms.  Has albuterol inhaler as needed and DuoNebs should they be needed.  Taking singular 10 mg daily and has stopped smoking.  Pulmonary exam normal.  Continue as needed nebulizer/inhaler.  Continue Singulair.  6. Iron deficiency anemia secondary to inadequate dietary iron intake History of iron deficiency requiring iron infusions but this has not been checked in a while.  Rechecking iron today. - Iron, TIBC and Ferritin Panel  7. Weight gain Despite working on eating a high-protein healthy diet and trying to get more exercise, she has continued to gain weight since she stopped smoking.  Has not been weighing her food portions and thinks this may be part of the problem.  For now, rechecking TSH and hemoglobin A1c for further evaluation. - TSH - Hemoglobin  A1c  8.  Constipation Has been struggling with feelings of being bloated and constipated.  No nausea, vomiting, or diarrhea.  Taking a fiber supplement daily.  Working to stay well-hydrated.  Discussed over-the-counter options for treatment.  She also notes taking a stool softener in the evenings  sometimes.  Recommend starting MiraLAX 17 g daily as needed and at least 8 ounces of fluid.  Continue fiber supplements.  Procedures performed this visit: None.  Return in about 6 months (around 01/29/2024) for chronic disease follow up.  __________________________________ Thayer Ohm, DNP, APRN, FNP-BC Primary Care and Sports Medicine [facility] [place]

## 2023-08-02 ENCOUNTER — Other Ambulatory Visit: Payer: Self-pay | Admitting: Medical-Surgical

## 2023-08-02 DIAGNOSIS — I10 Essential (primary) hypertension: Secondary | ICD-10-CM

## 2023-08-02 LAB — CBC WITH DIFFERENTIAL/PLATELET
Basophils Absolute: 0 10*3/uL (ref 0.0–0.2)
Basos: 1 %
EOS (ABSOLUTE): 0.2 10*3/uL (ref 0.0–0.4)
Eos: 4 %
Hematocrit: 39.1 % (ref 34.0–46.6)
Hemoglobin: 12.4 g/dL (ref 11.1–15.9)
Immature Grans (Abs): 0 10*3/uL (ref 0.0–0.1)
Immature Granulocytes: 0 %
Lymphocytes Absolute: 1.8 10*3/uL (ref 0.7–3.1)
Lymphs: 29 %
MCH: 27.1 pg (ref 26.6–33.0)
MCHC: 31.7 g/dL (ref 31.5–35.7)
MCV: 86 fL (ref 79–97)
Monocytes Absolute: 0.5 10*3/uL (ref 0.1–0.9)
Monocytes: 8 %
Neutrophils Absolute: 3.6 10*3/uL (ref 1.4–7.0)
Neutrophils: 58 %
Platelets: 307 10*3/uL (ref 150–450)
RBC: 4.57 x10E6/uL (ref 3.77–5.28)
RDW: 13.9 % (ref 11.7–15.4)
WBC: 6.2 10*3/uL (ref 3.4–10.8)

## 2023-08-02 LAB — CMP14+EGFR
ALT: 18 IU/L (ref 0–32)
AST: 24 IU/L (ref 0–40)
Albumin: 4.4 g/dL (ref 3.9–4.9)
Alkaline Phosphatase: 80 IU/L (ref 44–121)
BUN/Creatinine Ratio: 18 (ref 9–23)
BUN: 13 mg/dL (ref 6–24)
Bilirubin Total: 0.3 mg/dL (ref 0.0–1.2)
CO2: 23 mmol/L (ref 20–29)
Calcium: 9.5 mg/dL (ref 8.7–10.2)
Chloride: 96 mmol/L (ref 96–106)
Creatinine, Ser: 0.73 mg/dL (ref 0.57–1.00)
Globulin, Total: 2.8 g/dL (ref 1.5–4.5)
Glucose: 88 mg/dL (ref 70–99)
Potassium: 4 mmol/L (ref 3.5–5.2)
Sodium: 135 mmol/L (ref 134–144)
Total Protein: 7.2 g/dL (ref 6.0–8.5)
eGFR: 104 mL/min/{1.73_m2} (ref >59)

## 2023-08-02 LAB — LIPID PANEL
Chol/HDL Ratio: 3.3 ratio (ref 0.0–4.4)
Cholesterol, Total: 244 mg/dL — ABNORMAL HIGH (ref 100–199)
HDL: 73 mg/dL (ref >39)
LDL Chol Calc (NIH): 143 mg/dL — ABNORMAL HIGH (ref 0–99)
Triglycerides: 161 mg/dL — ABNORMAL HIGH (ref 0–149)
VLDL Cholesterol Cal: 28 mg/dL (ref 5–40)

## 2023-08-03 LAB — IRON,TIBC AND FERRITIN PANEL
Ferritin: 14 ng/mL — ABNORMAL LOW (ref 15–150)
Iron Saturation: 15 % (ref 15–55)
Iron: 68 ug/dL (ref 27–159)
Total Iron Binding Capacity: 459 ug/dL — ABNORMAL HIGH (ref 250–450)
UIBC: 391 ug/dL (ref 131–425)

## 2023-08-03 LAB — HEMOGLOBIN A1C
Est. average glucose Bld gHb Est-mCnc: 103 mg/dL
Hgb A1c MFr Bld: 5.2 % (ref 4.8–5.6)

## 2023-08-03 LAB — TSH: TSH: 2.41 u[IU]/mL (ref 0.450–4.500)

## 2023-08-03 LAB — BRAIN NATRIURETIC PEPTIDE: BNP: 4.7 pg/mL (ref 0.0–100.0)

## 2023-08-14 ENCOUNTER — Inpatient Hospital Stay (HOSPITAL_BASED_OUTPATIENT_CLINIC_OR_DEPARTMENT_OTHER): Payer: 59 | Admitting: Family

## 2023-08-14 ENCOUNTER — Encounter: Payer: Self-pay | Admitting: Family

## 2023-08-14 ENCOUNTER — Inpatient Hospital Stay: Payer: 59 | Attending: Hematology & Oncology

## 2023-08-14 ENCOUNTER — Encounter (HOSPITAL_COMMUNITY): Payer: Self-pay | Admitting: Medical-Surgical

## 2023-08-14 VITALS — BP 135/93 | HR 81 | Temp 99.1°F | Resp 18 | Wt 176.1 lb

## 2023-08-14 DIAGNOSIS — N920 Excessive and frequent menstruation with regular cycle: Secondary | ICD-10-CM | POA: Diagnosis present

## 2023-08-14 DIAGNOSIS — D500 Iron deficiency anemia secondary to blood loss (chronic): Secondary | ICD-10-CM | POA: Insufficient documentation

## 2023-08-14 DIAGNOSIS — D508 Other iron deficiency anemias: Secondary | ICD-10-CM

## 2023-08-14 LAB — RETICULOCYTES
Immature Retic Fract: 18.1 % — ABNORMAL HIGH (ref 2.3–15.9)
RBC.: 4.42 MIL/uL (ref 3.87–5.11)
Retic Count, Absolute: 113.2 10*3/uL (ref 19.0–186.0)
Retic Ct Pct: 2.6 % (ref 0.4–3.1)

## 2023-08-14 LAB — CBC WITH DIFFERENTIAL (CANCER CENTER ONLY)
Abs Immature Granulocytes: 0.14 10*3/uL — ABNORMAL HIGH (ref 0.00–0.07)
Basophils Absolute: 0 10*3/uL (ref 0.0–0.1)
Basophils Relative: 0 %
Eosinophils Absolute: 0.3 10*3/uL (ref 0.0–0.5)
Eosinophils Relative: 3 %
HCT: 38.4 % (ref 36.0–46.0)
Hemoglobin: 12.3 g/dL (ref 12.0–15.0)
Immature Granulocytes: 2 %
Lymphocytes Relative: 15 %
Lymphs Abs: 1.4 10*3/uL (ref 0.7–4.0)
MCH: 27.7 pg (ref 26.0–34.0)
MCHC: 32 g/dL (ref 30.0–36.0)
MCV: 86.5 fL (ref 80.0–100.0)
Monocytes Absolute: 0.7 10*3/uL (ref 0.1–1.0)
Monocytes Relative: 7 %
Neutro Abs: 6.8 10*3/uL (ref 1.7–7.7)
Neutrophils Relative %: 73 %
Platelet Count: 380 10*3/uL (ref 150–400)
RBC: 4.44 MIL/uL (ref 3.87–5.11)
RDW: 13.6 % (ref 11.5–15.5)
WBC Count: 9.4 10*3/uL (ref 4.0–10.5)
nRBC: 0 % (ref 0.0–0.2)

## 2023-08-14 NOTE — Progress Notes (Signed)
Hematology and Oncology Follow Up Visit  [name] [id number] March 05, 1976 47 y.o. 08/14/2023   Principle Diagnosis:  Iron deficiency anemia secondary to heavy cycles    Current Therapy:        IV iron as indicated    Interim History:  [name] is here today for follow-up. She is feeling fatigued, SOB with exertion, palpitations, dizziness and intermittent numbness and tingling in her hands.  Her cycle has been irregular skipping months but has since restarted with a very heavy flow per patient.  No other blood loss noted. No abnormal bruising, no petechiae.  No fever, chills, n/v, cough, rash, chest pain, abdominal pain or changes in bowel or bladder habits.  She takes a stool softener regularly to avoid constipation.  No swelling in her extremities.  No falls or syncope reported.  Appetite and hydration are good. Weight is stable at 176 lbs.   ECOG Performance Status: 1 - Symptomatic but completely ambulatory  Medications:  Allergies as of 08/14/2023       Reactions   Mosquito (diagnostic) Swelling   Amoxicillin Hives   Causes SEVERE hives (patient reports that she needed epi-pen/prednisone after receiving it).    Keflex [cephalexin] Hives   Penicillins Hives   Lisinopril Cough        Medication List        Accurate as of August 14, 2023 10:04 AM. If you have any questions, ask your nurse or doctor.          acetaminophen 500 MG tablet Commonly known as: TYLENOL Take 500-1,000 mg by mouth every 6 (six) hours as needed.   albuterol 108 (90 Base) MCG/ACT inhaler Commonly known as: VENTOLIN HFA Inhale 1-2 puffs into the lungs every 6 (six) hours as needed for wheezing or shortness of breath.   amphetamine-dextroamphetamine 20 MG tablet Commonly known as: ADDERALL Take 0.5-1 tablets (10-20 mg total) by mouth daily in the afternoon.   cyclobenzaprine 10 MG tablet Commonly known as: FLEXERIL TAKE 1/2 TO 1 TABLET BY MOUTH 3 TIMES DAILY AS NEEDED FOR  MUSCLE SPASMS (JAW CLENCHING)   EQUATE STOOL SOFTENER 100 MG capsule Generic drug: docusate sodium Take by mouth daily.   ferrous sulfate 325 (65 FE) MG tablet Take 325 mg by mouth daily with breakfast.   FLUoxetine 40 MG capsule Commonly known as: PROZAC Take 1 capsule (40 mg total) by mouth daily.   FLUoxetine 20 MG capsule Commonly known as: PROZAC Take 1 capsule (20 mg total) by mouth daily. Take with 40mg  dose to equal 60mg  daily.   fluticasone 50 MCG/ACT nasal spray Commonly known as: FLONASE Place 1 spray into both nostrils daily.   gabapentin 100 MG capsule Commonly known as: NEURONTIN Take 1-3 capsules (100-300 mg total) by mouth at bedtime.   hydrocortisone 2.5 % ointment Apply to affected area BID.   hydrOXYzine 50 MG capsule Commonly known as: VISTARIL TAKE 1 CAPSULE BY MOUTH THREE TIMES DAILY AS NEEDED FOR ANXIETY FOR SLEEP   ibuprofen 600 MG tablet Commonly known as: ADVIL Take 600 mg by mouth every 6 (six) hours as needed.   ipratropium-albuterol 0.5-2.5 (3) MG/3ML Soln Commonly known as: DUONEB Take 3 mLs by nebulization 3 (three) times daily.   lisdexamfetamine 50 MG capsule Commonly known as: VYVANSE Take 1 capsule (50 mg total) by mouth daily.   lisdexamfetamine 50 MG capsule Commonly known as: VYVANSE Take 1 capsule (50 mg total) by mouth daily.   lisdexamfetamine 50 MG capsule Commonly known as:  VYVANSE Take 1 capsule (50 mg total) by mouth daily. Start taking on: August 23, 2023   loratadine 10 MG tablet Commonly known as: CLARITIN Take 10 mg by mouth daily.   montelukast 10 MG tablet Commonly known as: SINGULAIR Take 10 mg by mouth daily.   omeprazole 40 MG capsule Commonly known as: PRILOSEC Take 40 mg by mouth daily.   Symbicort 160-4.5 MCG/ACT inhaler Generic drug: budesonide-formoterol Inhale 2 puffs into the lungs 2 (two) times daily.   Thera Tabs Take 1 tablet by mouth daily.   tranexamic acid 650 MG Tabs  tablet Commonly known as: LYSTEDA Take 2 tablets (1,300 mg total) by mouth 3 (three) times daily. Take during menses for a maximum of five days   triamterene-hydrochlorothiazide 37.5-25 MG tablet Commonly known as: MAXZIDE-25 Take 1 tablet by mouth daily.   verapamil 120 MG 24 hr capsule Commonly known as: VERELAN Take 1 capsule by mouth at bedtime   VITAMIN C PO Take 2 tablets by mouth daily at 6 (six) AM. Unsure dose - takes with ferrous sulfate        Allergies:  Allergies  Allergen Reactions   Mosquito (Diagnostic) Swelling   Amoxicillin Hives    Causes SEVERE hives (patient reports that she needed epi-pen/prednisone after receiving it).    Keflex [Cephalexin] Hives   Penicillins Hives   Lisinopril Cough    Past Medical History, Surgical history, Social history, and Family History were reviewed and updated.  Review of Systems: All other 10 point review of systems is negative.   Physical Exam:  weight is 176 lb 1.3 oz (79.9 kg). Her oral temperature is 99.1 F (37.3 C). Her blood pressure is 135/93 (abnormal) and her pulse is 81. Her respiration is 18 and oxygen saturation is 100%.   Wt Readings from Last 3 Encounters:  08/14/23 176 lb 1.3 oz (79.9 kg)  08/01/23 177 lb (80.3 kg)  06/06/23 168 lb (76.2 kg)    Ocular: Sclerae unicteric, pupils equal, round and reactive to light Ear-nose-throat: Oropharynx clear, dentition fair Lymphatic: No cervical or supraclavicular adenopathy Lungs no rales or rhonchi, good excursion bilaterally Heart regular rate and rhythm, no murmur appreciated Abd soft, nontender, positive bowel sounds MSK no focal spinal tenderness, no joint edema Neuro: non-focal, well-oriented, appropriate affect Breasts: Deferred   Lab Results  Component Value Date   WBC 9.4 08/14/2023   HGB 12.3 08/14/2023   HCT 38.4 08/14/2023   MCV 86.5 08/14/2023   PLT 380 08/14/2023   Lab Results  Component Value Date   FERRITIN 14 (L) 08/01/2023    IRON 68 08/01/2023   TIBC 459 (H) 08/01/2023   UIBC 391 08/01/2023   IRONPCTSAT 15 08/01/2023   Lab Results  Component Value Date   RETICCTPCT 2.6 08/14/2023   RBC 4.42 08/14/2023   RBC 4.44 08/14/2023   No results found for: "KPAFRELGTCHN", "LAMBDASER", "KAPLAMBRATIO" No results found for: "IGGSERUM", "IGA", "IGMSERUM" No results found for: "TOTALPROTELP", "ALBUMINELP", "A1GS", "A2GS", "BETS", "BETA2SER", "GAMS", "MSPIKE", "SPEI"   Chemistry      Component Value Date/Time   NA 135 08/01/2023 1137   K 4.0 08/01/2023 1137   CL 96 08/01/2023 1137   CO2 23 08/01/2023 1137   BUN 13 08/01/2023 1137   CREATININE 0.73 08/01/2023 1137   CREATININE 0.82 06/21/2022 1027   CREATININE 0.59 01/27/2022 0000      Component Value Date/Time   CALCIUM 9.5 08/01/2023 1137   ALKPHOS 80 08/01/2023 1137   AST 24 08/01/2023  1137   AST 15 06/21/2022 1027   ALT 18 08/01/2023 1137   ALT 11 06/21/2022 1027   BILITOT 0.3 08/01/2023 1137   BILITOT 0.6 06/21/2022 1027       Impression and Plan:  [name] is a very pleasant 47 yo caucasian female with long history of iron deficiency anemia.  We will get her set up for 3 doses of IV iron for low ferritin and iron saturation.  Follow-up in 6 months.   Eileen Stanford, NP 9/17/202410:04 AM

## 2023-08-21 ENCOUNTER — Inpatient Hospital Stay: Payer: 59

## 2023-08-21 VITALS — BP 135/82 | HR 70 | Temp 98.2°F | Resp 17

## 2023-08-21 DIAGNOSIS — D500 Iron deficiency anemia secondary to blood loss (chronic): Secondary | ICD-10-CM | POA: Diagnosis not present

## 2023-08-21 DIAGNOSIS — D508 Other iron deficiency anemias: Secondary | ICD-10-CM

## 2023-08-21 MED ORDER — SODIUM CHLORIDE 0.9 % IV SOLN: Freq: Once | INTRAVENOUS | Status: AC

## 2023-08-21 MED ORDER — SODIUM CHLORIDE 0.9 % IV SOLN
300.0000 mg | Freq: Once | INTRAVENOUS | Status: AC
  Administered 2023-08-21: 300 mg via INTRAVENOUS
  Filled 2023-08-21: qty 300

## 2023-08-21 MED ORDER — LISDEXAMFETAMINE DIMESYLATE 50 MG PO CAPS: 50.0000 mg | ORAL_CAPSULE | Freq: Every day | ORAL | 0 refills | Status: DC

## 2023-08-21 NOTE — Patient Instructions (Signed)
Iron Sucrose Injection What is this medication? IRON SUCROSE (EYE ern SOO krose) treats low levels of iron (iron deficiency anemia) in people with kidney disease. Iron is a mineral that plays an important role in making red blood cells, which carry oxygen from your lungs to the rest of your body. This medicine may be used for other purposes; ask your health care provider or pharmacist if you have questions. COMMON BRAND NAME(S): Venofer What should I tell my care team before I take this medication? They need to know if you have any of these conditions: Anemia not caused by low iron levels Heart disease High levels of iron in the blood Kidney disease Liver disease An unusual or allergic reaction to iron, other medications, foods, dyes, or preservatives Pregnant or trying to get pregnant Breastfeeding How should I use this medication? This medication is for infusion into a vein. It is given in a hospital or clinic setting. Talk to your care team about the use of this medication in children. While this medication may be prescribed for children as young as 2 years for selected conditions, precautions do apply. Overdosage: If you think you have taken too much of this medicine contact a poison control center or emergency room at once. NOTE: This medicine is only for you. Do not share this medicine with others. What if I miss a dose? Keep appointments for follow-up doses. It is important not to miss your dose. Call your care team if you are unable to keep an appointment. What may interact with this medication? Do not take this medication with any of the following: Deferoxamine Dimercaprol Other iron products This medication may also interact with the following: Chloramphenicol Deferasirox This list may not describe all possible interactions. Give your health care provider a list of all the medicines, herbs, non-prescription drugs, or dietary supplements you use. Also tell them if you smoke,  drink alcohol, or use illegal drugs. Some items may interact with your medicine. What should I watch for while using this medication? Visit your care team regularly. Tell your care team if your symptoms do not start to get better or if they get worse. You may need blood work done while you are taking this medication. You may need to follow a special diet. Talk to your care team. Foods that contain iron include: whole grains/cereals, dried fruits, beans, or peas, leafy green vegetables, and organ meats (liver, kidney). What side effects may I notice from receiving this medication? Side effects that you should report to your care team as soon as possible: Allergic reactions--skin rash, itching, hives, swelling of the face, lips, tongue, or throat Low blood pressure--dizziness, feeling faint or lightheaded, blurry vision Shortness of breath Side effects that usually do not require medical attention (report to your care team if they continue or are bothersome): Flushing Headache Joint pain Muscle pain Nausea Pain, redness, or irritation at injection site This list may not describe all possible side effects. Call your doctor for medical advice about side effects. You may report side effects to FDA at 1-800-FDA-1088. Where should I keep my medication? This medication is given in a hospital or clinic. It will not be stored at home. NOTE: This sheet is a summary. It may not cover all possible information. If you have questions about this medicine, talk to your doctor, pharmacist, or health care provider.  2024 Elsevier/Gold Standard (2023-04-20 00:00:00)

## 2023-08-27 ENCOUNTER — Inpatient Hospital Stay: Payer: 59

## 2023-08-27 VITALS — BP 127/85 | HR 73 | Temp 97.9°F | Resp 17

## 2023-08-27 DIAGNOSIS — D508 Other iron deficiency anemias: Secondary | ICD-10-CM

## 2023-08-27 DIAGNOSIS — D500 Iron deficiency anemia secondary to blood loss (chronic): Secondary | ICD-10-CM | POA: Diagnosis not present

## 2023-08-27 MED ORDER — SODIUM CHLORIDE 0.9 % IV SOLN: Freq: Once | INTRAVENOUS | Status: AC

## 2023-08-27 MED ORDER — SODIUM CHLORIDE 0.9 % IV SOLN
300.0000 mg | Freq: Once | INTRAVENOUS | Status: AC
  Administered 2023-08-27: 300 mg via INTRAVENOUS
  Filled 2023-08-27: qty 300

## 2023-08-27 NOTE — Progress Notes (Signed)
Pt declined to stay for post infusion observation period. Pt stated she has tolerated medication multiple times prior without difficulty. Pt aware to call clinic with any questions or concerns. Pt verbalized understanding and had no further questions.  ? ?

## 2023-08-27 NOTE — Patient Instructions (Signed)
Iron Sucrose Injection What is this medication? IRON SUCROSE (EYE ern SOO krose) treats low levels of iron (iron deficiency anemia) in people with kidney disease. Iron is a mineral that plays an important role in making red blood cells, which carry oxygen from your lungs to the rest of your body. This medicine may be used for other purposes; ask your health care provider or pharmacist if you have questions. COMMON BRAND NAME(S): Venofer What should I tell my care team before I take this medication? They need to know if you have any of these conditions: Anemia not caused by low iron levels Heart disease High levels of iron in the blood Kidney disease Liver disease An unusual or allergic reaction to iron, other medications, foods, dyes, or preservatives Pregnant or trying to get pregnant Breastfeeding How should I use this medication? This medication is for infusion into a vein. It is given in a hospital or clinic setting. Talk to your care team about the use of this medication in children. While this medication may be prescribed for children as young as 2 years for selected conditions, precautions do apply. Overdosage: If you think you have taken too much of this medicine contact a poison control center or emergency room at once. NOTE: This medicine is only for you. Do not share this medicine with others. What if I miss a dose? Keep appointments for follow-up doses. It is important not to miss your dose. Call your care team if you are unable to keep an appointment. What may interact with this medication? Do not take this medication with any of the following: Deferoxamine Dimercaprol Other iron products This medication may also interact with the following: Chloramphenicol Deferasirox This list may not describe all possible interactions. Give your health care provider a list of all the medicines, herbs, non-prescription drugs, or dietary supplements you use. Also tell them if you smoke,  drink alcohol, or use illegal drugs. Some items may interact with your medicine. What should I watch for while using this medication? Visit your care team regularly. Tell your care team if your symptoms do not start to get better or if they get worse. You may need blood work done while you are taking this medication. You may need to follow a special diet. Talk to your care team. Foods that contain iron include: whole grains/cereals, dried fruits, beans, or peas, leafy green vegetables, and organ meats (liver, kidney). What side effects may I notice from receiving this medication? Side effects that you should report to your care team as soon as possible: Allergic reactions--skin rash, itching, hives, swelling of the face, lips, tongue, or throat Low blood pressure--dizziness, feeling faint or lightheaded, blurry vision Shortness of breath Side effects that usually do not require medical attention (report to your care team if they continue or are bothersome): Flushing Headache Joint pain Muscle pain Nausea Pain, redness, or irritation at injection site This list may not describe all possible side effects. Call your doctor for medical advice about side effects. You may report side effects to FDA at 1-800-FDA-1088. Where should I keep my medication? This medication is given in a hospital or clinic. It will not be stored at home. NOTE: This sheet is a summary. It may not cover all possible information. If you have questions about this medicine, talk to your doctor, pharmacist, or health care provider.  2024 Elsevier/Gold Standard (2023-04-20 00:00:00)

## 2023-08-28 ENCOUNTER — Telehealth (HOSPITAL_COMMUNITY): Payer: Self-pay | Admitting: Medical-Surgical

## 2023-08-28 NOTE — Telephone Encounter (Signed)
Just an FYI. We have made several attempts to contact this patient including sending a letter to schedule or reschedule their echocardiogram. We will be removing the patient from the echo WQ.   SENT LETTER THRU MY CHART/LBW  08/14/23 [facility] to schedule x 3 @ 2:44/LBW  08/13/23 [facility] to schedule @ 11:54/LBW  08/07/23 [facility] to schedule @ 11:55/LBW     Thank you

## 2023-09-04 ENCOUNTER — Inpatient Hospital Stay: Payer: 59 | Attending: Hematology & Oncology

## 2023-09-04 VITALS — BP 130/87 | HR 73 | Temp 97.5°F | Resp 20

## 2023-09-04 DIAGNOSIS — D500 Iron deficiency anemia secondary to blood loss (chronic): Secondary | ICD-10-CM | POA: Insufficient documentation

## 2023-09-04 DIAGNOSIS — N920 Excessive and frequent menstruation with regular cycle: Secondary | ICD-10-CM | POA: Diagnosis present

## 2023-09-04 DIAGNOSIS — D508 Other iron deficiency anemias: Secondary | ICD-10-CM

## 2023-09-04 MED ORDER — SODIUM CHLORIDE 0.9 % IV SOLN
300.0000 mg | Freq: Once | INTRAVENOUS | Status: AC
  Administered 2023-09-04: 300 mg via INTRAVENOUS
  Filled 2023-09-04: qty 300

## 2023-09-04 MED ORDER — SODIUM CHLORIDE 0.9 % IV SOLN: Freq: Once | INTRAVENOUS | Status: AC

## 2023-09-04 NOTE — Patient Instructions (Signed)
Iron Sucrose Injection What is this medication? IRON SUCROSE (EYE ern SOO krose) treats low levels of iron (iron deficiency anemia) in people with kidney disease. Iron is a mineral that plays an important role in making red blood cells, which carry oxygen from your lungs to the rest of your body. This medicine may be used for other purposes; ask your health care provider or pharmacist if you have questions. COMMON BRAND NAME(S): Venofer What should I tell my care team before I take this medication? They need to know if you have any of these conditions: Anemia not caused by low iron levels Heart disease High levels of iron in the blood Kidney disease Liver disease An unusual or allergic reaction to iron, other medications, foods, dyes, or preservatives Pregnant or trying to get pregnant Breastfeeding How should I use this medication? This medication is for infusion into a vein. It is given in a hospital or clinic setting. Talk to your care team about the use of this medication in children. While this medication may be prescribed for children as young as 2 years for selected conditions, precautions do apply. Overdosage: If you think you have taken too much of this medicine contact a poison control center or emergency room at once. NOTE: This medicine is only for you. Do not share this medicine with others. What if I miss a dose? Keep appointments for follow-up doses. It is important not to miss your dose. Call your care team if you are unable to keep an appointment. What may interact with this medication? Do not take this medication with any of the following: Deferoxamine Dimercaprol Other iron products This medication may also interact with the following: Chloramphenicol Deferasirox This list may not describe all possible interactions. Give your health care provider a list of all the medicines, herbs, non-prescription drugs, or dietary supplements you use. Also tell them if you smoke,  drink alcohol, or use illegal drugs. Some items may interact with your medicine. What should I watch for while using this medication? Visit your care team regularly. Tell your care team if your symptoms do not start to get better or if they get worse. You may need blood work done while you are taking this medication. You may need to follow a special diet. Talk to your care team. Foods that contain iron include: whole grains/cereals, dried fruits, beans, or peas, leafy green vegetables, and organ meats (liver, kidney). What side effects may I notice from receiving this medication? Side effects that you should report to your care team as soon as possible: Allergic reactions--skin rash, itching, hives, swelling of the face, lips, tongue, or throat Low blood pressure--dizziness, feeling faint or lightheaded, blurry vision Shortness of breath Side effects that usually do not require medical attention (report to your care team if they continue or are bothersome): Flushing Headache Joint pain Muscle pain Nausea Pain, redness, or irritation at injection site This list may not describe all possible side effects. Call your doctor for medical advice about side effects. You may report side effects to FDA at 1-800-FDA-1088. Where should I keep my medication? This medication is given in a hospital or clinic. It will not be stored at home. NOTE: This sheet is a summary. It may not cover all possible information. If you have questions about this medicine, talk to your doctor, pharmacist, or health care provider.  2024 Elsevier/Gold Standard (2023-04-20 00:00:00)

## 2023-09-11 ENCOUNTER — Ambulatory Visit (INDEPENDENT_AMBULATORY_CARE_PROVIDER_SITE_OTHER): Payer: 59 | Admitting: Medical-Surgical

## 2023-09-11 VITALS — BP 139/91 | HR 75 | Resp 20 | Ht 61.0 in | Wt 177.0 lb

## 2023-09-11 DIAGNOSIS — F411 Generalized anxiety disorder: Secondary | ICD-10-CM

## 2023-09-11 DIAGNOSIS — F902 Attention-deficit hyperactivity disorder, combined type: Secondary | ICD-10-CM

## 2023-09-11 MED ORDER — FLUOXETINE HCL 40 MG PO CAPS: 40.0000 mg | ORAL_CAPSULE | Freq: Every day | ORAL | 1 refills | Status: DC

## 2023-09-11 MED ORDER — AMPHETAMINE-DEXTROAMPHETAMINE 20 MG PO TABS: 10.0000 mg | ORAL_TABLET | Freq: Every day | ORAL | 0 refills | Status: DC

## 2023-09-11 MED ORDER — LISDEXAMFETAMINE DIMESYLATE 50 MG PO CAPS: 50.0000 mg | ORAL_CAPSULE | Freq: Every day | ORAL | 0 refills | Status: DC

## 2023-09-11 MED ORDER — FLUOXETINE HCL 20 MG PO CAPS: 20.0000 mg | ORAL_CAPSULE | Freq: Every day | ORAL | 3 refills | Status: DC

## 2023-09-11 NOTE — Progress Notes (Signed)
        Established patient visit  History, exam, impression, and plan:  1. Attention deficit hyperactivity disorder (ADHD), combined type Pleasant 47 year old female presenting today for follow-up on ADHD.  She has been taking Vyvanse 50 mg daily with an afternoon Adderall 10-20 mg instant release dose.  Notes that she ran out of Adderall and has not had this recently.  Feels the Vyvanse helps for a while but it wears off early in the day around 3-4:00.  Continues to have difficulty with task completion and focus without jumping to multiple activities without finishing anything.  We discussed starting counseling.  She already had a referral placed but has not scheduled this yet.  Strongly recommend she proceed with scheduling counseling to see if this will provide her with some resources and life skills that may better manage her attention deficit symptoms.  We also discussed some other conservative measures to help with organization throughout the day and I have encouraged her to look online and see what options she can find that may be beneficial.  For now, continue Vyvanse 50 mg daily and restart Adderall 10-20 mg instant release dose in the afternoons.  2. GAD (generalized anxiety disorder) Doing fairly well on fluoxetine 60 mg daily.  Refilling today. - FLUoxetine (PROZAC) 40 MG capsule; Take 1 capsule (40 mg total) by mouth daily.  Dispense: 90 capsule; Refill: 1   Procedures performed this visit: None.  Return in about 3 months (around 12/12/2023) for ADHD follow up.  __________________________________ Thayer Ohm, DNP, APRN, FNP-BC Primary Care and Sports Medicine [facility] [place]

## 2023-09-30 ENCOUNTER — Other Ambulatory Visit: Payer: Self-pay | Admitting: Medical-Surgical

## 2023-10-19 ENCOUNTER — Other Ambulatory Visit: Payer: Self-pay | Admitting: Medical-Surgical

## 2023-11-28 ENCOUNTER — Other Ambulatory Visit: Payer: Self-pay | Admitting: Medical-Surgical

## 2023-12-12 ENCOUNTER — Ambulatory Visit (INDEPENDENT_AMBULATORY_CARE_PROVIDER_SITE_OTHER): Payer: 59 | Admitting: Medical-Surgical

## 2023-12-12 ENCOUNTER — Encounter: Payer: Self-pay | Admitting: Medical-Surgical

## 2023-12-12 ENCOUNTER — Ambulatory Visit: Payer: 59 | Attending: Medical-Surgical

## 2023-12-12 VITALS — BP 145/90 | HR 79 | Resp 20 | Ht 61.0 in | Wt 177.7 lb

## 2023-12-12 DIAGNOSIS — R002 Palpitations: Secondary | ICD-10-CM

## 2023-12-12 DIAGNOSIS — F5104 Psychophysiologic insomnia: Secondary | ICD-10-CM

## 2023-12-12 DIAGNOSIS — F902 Attention-deficit hyperactivity disorder, combined type: Secondary | ICD-10-CM | POA: Diagnosis not present

## 2023-12-12 DIAGNOSIS — J309 Allergic rhinitis, unspecified: Secondary | ICD-10-CM

## 2023-12-12 DIAGNOSIS — F411 Generalized anxiety disorder: Secondary | ICD-10-CM | POA: Diagnosis not present

## 2023-12-12 DIAGNOSIS — J4520 Mild intermittent asthma, uncomplicated: Secondary | ICD-10-CM | POA: Diagnosis not present

## 2023-12-12 DIAGNOSIS — I10 Essential (primary) hypertension: Secondary | ICD-10-CM | POA: Diagnosis not present

## 2023-12-12 MED ORDER — LISDEXAMFETAMINE DIMESYLATE 50 MG PO CAPS: 50.0000 mg | ORAL_CAPSULE | Freq: Every day | ORAL | 0 refills | Status: DC

## 2023-12-12 MED ORDER — ALBUTEROL SULFATE HFA 108 (90 BASE) MCG/ACT IN AERS: 1.0000 | INHALATION_SPRAY | Freq: Four times a day (QID) | RESPIRATORY_TRACT | 0 refills | Status: DC | PRN

## 2023-12-12 MED ORDER — VERAPAMIL HCL ER 120 MG PO CP24: 120.0000 mg | ORAL_CAPSULE | Freq: Every day | ORAL | 0 refills | Status: DC

## 2023-12-12 MED ORDER — FLUOXETINE HCL 20 MG PO CAPS: 20.0000 mg | ORAL_CAPSULE | Freq: Every day | ORAL | 3 refills | Status: DC

## 2023-12-12 MED ORDER — TRIAMTERENE-HCTZ 37.5-25 MG PO TABS: 1.0000 | ORAL_TABLET | Freq: Every day | ORAL | 3 refills | Status: DC

## 2023-12-12 MED ORDER — FLUOXETINE HCL 40 MG PO CAPS: 40.0000 mg | ORAL_CAPSULE | Freq: Every day | ORAL | 1 refills | Status: DC

## 2023-12-12 MED ORDER — FLUTICASONE PROPIONATE 50 MCG/ACT NA SUSP: 1.0000 | Freq: Every day | NASAL | 0 refills | Status: AC

## 2023-12-12 MED ORDER — HYDROXYZINE PAMOATE 50 MG PO CAPS: ORAL_CAPSULE | ORAL | 0 refills | Status: DC

## 2023-12-12 MED ORDER — AMPHETAMINE-DEXTROAMPHETAMINE 20 MG PO TABS: 10.0000 mg | ORAL_TABLET | Freq: Every day | ORAL | 0 refills | Status: DC

## 2023-12-12 MED ORDER — GABAPENTIN 100 MG PO CAPS: 100.0000 mg | ORAL_CAPSULE | Freq: Every day | ORAL | 1 refills | Status: DC

## 2023-12-12 MED ORDER — MONTELUKAST SODIUM 10 MG PO TABS: 10.0000 mg | ORAL_TABLET | Freq: Every day | ORAL | 1 refills | Status: DC

## 2023-12-12 NOTE — Progress Notes (Signed)
 Established patient visit  History, exam, impression, and plan:  1. Attention deficit hyperactivity disorder (ADHD), combined type (Primary) Very pleasant 48 year old female presenting today with a history of ADHD.  She is currently taking Vyvanse  50 mg daily in the morning and feels that the medication does work well for her.  She does have a reduction in effectiveness in the early afternoon at times which is now treated with Adderall instant release 10-20 mg daily as needed.  This varies with inconsistent dosing depending on the needs of the day.  Does not have any issues with side effects, unusual sleep pattern changes, weight fluctuations, or appetite suppression.  Stable on current regimen.  Continue Vyvanse  daily as prescribed with as needed afternoon Adderall. - lisdexamfetamine (VYVANSE ) 50 MG capsule; Take 1 capsule (50 mg total) by mouth daily.  Dispense: 30 capsule; Refill: 0 - lisdexamfetamine (VYVANSE ) 50 MG capsule; Take 1 capsule (50 mg total) by mouth daily.  Dispense: 30 capsule; Refill: 0 - lisdexamfetamine (VYVANSE ) 50 MG capsule; Take 1 capsule (50 mg total) by mouth daily.  Dispense: 30 capsule; Refill: 0 - amphetamine -dextroamphetamine  (ADDERALL) 20 MG tablet; Take 0.5-1 tablets (10-20 mg total) by mouth daily in the afternoon.  Dispense: 30 tablet; Refill: 0  2. GAD (generalized anxiety disorder) Long history of generalized anxiety which at times has been debilitating.  She is now taking fluoxetine  60 mg daily, tolerating well without side effects.  Has noted an improvement in symptoms with the increased dose since we went up on it.  Feels that her mood is now a lot more stable and her anxiety is better controlled.  Does have some breakthrough anxiety which is treated with hydroxyzine .  Often takes this at night as anxiety keeps her from being able to get to sleep.  Currently happy with this regimen.  Mood, affect, thoughts, speech pattern all normal today.  Continue  fluoxetine  and hydroxyzine  as prescribed. - FLUoxetine  (PROZAC ) 20 MG capsule; Take 1 capsule (20 mg total) by mouth daily. Take with 40mg  dose to equal 60mg  daily.  Dispense: 90 capsule; Refill: 3 - FLUoxetine  (PROZAC ) 40 MG capsule; Take 1 capsule (40 mg total) by mouth daily.  Dispense: 90 capsule; Refill: 1 - hydrOXYzine  (VISTARIL ) 50 MG capsule; TAKE 1 CAPSULE BY MOUTH THREE TIMES DAILY AS NEEDED FOR ANXIETY FOR SLEEP  Dispense: 90 capsule; Refill: 0  3. Primary hypertension History of primary hypertension.  She is taking verapamil  120 mg daily along with triamterene -HCTZ.  Tolerating both medications well without side effects.  Notes palpitations, see below.  No other concerning symptoms.  Blood pressure is mildly elevated today however this was taken when she was anxious and worried about running late.  Unfortunately, failed to recheck before she left the office.  Recommend monitoring blood pressure at home with a goal of 130/80 or less.  Low-sodium diet, regular intentional exercise, and weight loss to a healthy weight also recommended.  Continue triamterene -HCTZ and verapamil  as prescribed. - triamterene -hydrochlorothiazide  (MAXZIDE-25) 37.5-25 MG tablet; Take 1 tablet by mouth daily.  Dispense: 90 tablet; Refill: 3 - verapamil  (VERELAN ) 120 MG 24 hr capsule; Take 1 capsule (120 mg total) by mouth at bedtime.  Dispense: 90 capsule; Refill: 0  4. Mild intermittent asthma without complication Has Symbicort at home that was previously prescribed by pulmonology.  Has not followed up with them in a while.  Reports that she is doing well without having to use the Symbicort.  Has had an  upper respiratory illness for the last few weeks and the cough seems to be lingering with some congestion.  Refilling albuterol  for as needed use.  Advised starting Symbicort twice daily for the next 7 to 10 days to see if this is helpful.  Okay to add plain Mucinex.  Start a daily antihistamine.  If symptoms do not  resolve or start to worsen, return for further evaluation. - albuterol  (VENTOLIN  HFA) 108 (90 Base) MCG/ACT inhaler; Inhale 1-2 puffs into the lungs every 6 (six) hours as needed for wheezing or shortness of breath.  Dispense: 18 g; Refill: 0  5. Allergic rhinitis, unspecified seasonality, unspecified trigger Refilling Singulair  and Flonase . - fluticasone  (FLONASE ) 50 MCG/ACT nasal spray; Place 1 spray into both nostrils daily.  Dispense: 1 g; Refill: 0 - montelukast  (SINGULAIR ) 10 MG tablet; Take 1 tablet (10 mg total) by mouth daily.  Dispense: 30 tablet; Refill: 1  6. Psychophysiological insomnia Continues to have some issues with insomnia secondary to mental health concerns.  We had started gabapentin  and she notes that this is helpful when she takes 100-200 mg nightly.  Continue gabapentin  as needed for sleep. - gabapentin  (NEURONTIN ) 100 MG capsule; Take 1-3 capsules (100-300 mg total) by mouth at bedtime.  Dispense: 90 capsule; Refill: 1  7.  Palpitations Previously noted to have inconsistent episodes of being aware of her heartbeat at night.  Notes this has increased in frequency and is now occurring nightly.  There are times that she feels as if her heart is pounding and has noted some times where she felt like it skipped beats or was fluttering.  Previous EKG unconcerning but with the worsening in symptoms, plan for long-term heart monitor for 7 days for further evaluation.  Procedures performed this visit: None.  Return in about 3 months (around 03/11/2024) for ADHD follow up.  __________________________________ Maryl Snook, DNP, APRN, FNP-BC Primary Care and Sports Medicine [facility] [place]

## 2023-12-12 NOTE — Progress Notes (Unsigned)
 EP to read.

## 2023-12-16 DIAGNOSIS — R002 Palpitations: Secondary | ICD-10-CM | POA: Diagnosis not present

## 2023-12-31 ENCOUNTER — Encounter: Payer: Self-pay | Admitting: Medical-Surgical

## 2023-12-31 DIAGNOSIS — F902 Attention-deficit hyperactivity disorder, combined type: Secondary | ICD-10-CM

## 2024-01-01 NOTE — Telephone Encounter (Signed)
 Vyvanse  rxs for 1/15, 2/14 & 3/16 cancelled at [facility] pharmacy located on [address]. Per Pharmacist Dylan, patient never pick up the Vyvanse  rx dated for 12/12/23. The pharmacy had it ready for a pick up. The pharmacist was informed to return the rx to their stock.   Please send the rxs to [facility] located on [address]. Thanks in advance.

## 2024-01-02 MED ORDER — LISDEXAMFETAMINE DIMESYLATE 50 MG PO CAPS: 50.0000 mg | ORAL_CAPSULE | Freq: Every day | ORAL | 0 refills | Status: DC

## 2024-01-04 ENCOUNTER — Encounter: Payer: Self-pay | Admitting: Medical-Surgical

## 2024-01-29 ENCOUNTER — Ambulatory Visit: Payer: 59 | Admitting: Medical-Surgical

## 2024-02-05 ENCOUNTER — Other Ambulatory Visit: Payer: Self-pay | Admitting: Medical-Surgical

## 2024-02-05 DIAGNOSIS — F411 Generalized anxiety disorder: Secondary | ICD-10-CM

## 2024-02-11 ENCOUNTER — Inpatient Hospital Stay: Payer: 59 | Attending: Hematology & Oncology

## 2024-02-11 ENCOUNTER — Inpatient Hospital Stay: Payer: 59 | Admitting: Family

## 2024-02-12 ENCOUNTER — Other Ambulatory Visit: Payer: Self-pay | Admitting: Medical-Surgical

## 2024-02-12 DIAGNOSIS — F902 Attention-deficit hyperactivity disorder, combined type: Secondary | ICD-10-CM

## 2024-02-15 DIAGNOSIS — F50819 Binge eating disorder, unspecified: Secondary | ICD-10-CM | POA: Insufficient documentation

## 2024-02-29 ENCOUNTER — Other Ambulatory Visit: Payer: Self-pay | Admitting: Medical-Surgical

## 2024-02-29 DIAGNOSIS — J4520 Mild intermittent asthma, uncomplicated: Secondary | ICD-10-CM

## 2024-03-14 ENCOUNTER — Ambulatory Visit: Payer: 59 | Admitting: Medical-Surgical

## 2024-04-22 ENCOUNTER — Other Ambulatory Visit: Payer: Self-pay | Admitting: Medical-Surgical

## 2024-05-20 ENCOUNTER — Encounter: Payer: Self-pay | Admitting: Medical-Surgical

## 2024-05-20 DIAGNOSIS — F902 Attention-deficit hyperactivity disorder, combined type: Secondary | ICD-10-CM

## 2024-05-21 MED ORDER — LISDEXAMFETAMINE DIMESYLATE 50 MG PO CAPS: 50.0000 mg | ORAL_CAPSULE | Freq: Every day | ORAL | 0 refills | Status: DC

## 2024-05-21 NOTE — Telephone Encounter (Signed)
 Patient requesting to move prescription of vyvanse  to [facility]  due to cost Last written 03/02/2024 Last OV 12/12/2023 Upcoming appt = none

## 2024-06-02 ENCOUNTER — Other Ambulatory Visit: Payer: Self-pay | Admitting: Medical-Surgical

## 2024-06-02 DIAGNOSIS — F5104 Psychophysiologic insomnia: Secondary | ICD-10-CM

## 2024-06-02 DIAGNOSIS — I10 Essential (primary) hypertension: Secondary | ICD-10-CM

## 2024-06-02 DIAGNOSIS — F411 Generalized anxiety disorder: Secondary | ICD-10-CM

## 2024-06-29 ENCOUNTER — Other Ambulatory Visit: Payer: Self-pay | Admitting: Medical-Surgical

## 2024-06-29 DIAGNOSIS — I10 Essential (primary) hypertension: Secondary | ICD-10-CM

## 2024-06-29 DIAGNOSIS — F411 Generalized anxiety disorder: Secondary | ICD-10-CM

## 2024-09-02 ENCOUNTER — Other Ambulatory Visit: Payer: Self-pay | Admitting: Medical Genetics

## 2024-09-24 ENCOUNTER — Encounter: Payer: Self-pay | Admitting: Medical-Surgical

## 2024-09-24 ENCOUNTER — Ambulatory Visit (INDEPENDENT_AMBULATORY_CARE_PROVIDER_SITE_OTHER): Admitting: Medical-Surgical

## 2024-09-24 VITALS — BP 131/84 | HR 78 | Resp 20 | Ht 61.0 in | Wt 192.2 lb

## 2024-09-24 DIAGNOSIS — F902 Attention-deficit hyperactivity disorder, combined type: Secondary | ICD-10-CM | POA: Diagnosis not present

## 2024-09-24 DIAGNOSIS — I10 Essential (primary) hypertension: Secondary | ICD-10-CM

## 2024-09-24 DIAGNOSIS — F411 Generalized anxiety disorder: Secondary | ICD-10-CM

## 2024-09-24 DIAGNOSIS — J309 Allergic rhinitis, unspecified: Secondary | ICD-10-CM | POA: Diagnosis not present

## 2024-09-24 DIAGNOSIS — Z1211 Encounter for screening for malignant neoplasm of colon: Secondary | ICD-10-CM

## 2024-09-24 DIAGNOSIS — F5104 Psychophysiologic insomnia: Secondary | ICD-10-CM

## 2024-09-24 DIAGNOSIS — N951 Menopausal and female climacteric states: Secondary | ICD-10-CM | POA: Insufficient documentation

## 2024-09-24 DIAGNOSIS — F458 Other somatoform disorders: Secondary | ICD-10-CM | POA: Insufficient documentation

## 2024-09-24 DIAGNOSIS — J4520 Mild intermittent asthma, uncomplicated: Secondary | ICD-10-CM

## 2024-09-24 DIAGNOSIS — Z23 Encounter for immunization: Secondary | ICD-10-CM

## 2024-09-24 DIAGNOSIS — N938 Other specified abnormal uterine and vaginal bleeding: Secondary | ICD-10-CM

## 2024-09-24 DIAGNOSIS — D508 Other iron deficiency anemias: Secondary | ICD-10-CM

## 2024-09-24 MED ORDER — FLUOXETINE HCL 40 MG PO CAPS: 40.0000 mg | ORAL_CAPSULE | Freq: Every day | ORAL | 1 refills | Status: AC

## 2024-09-24 MED ORDER — LISDEXAMFETAMINE DIMESYLATE 50 MG PO CAPS
50.0000 mg | ORAL_CAPSULE | Freq: Every day | ORAL | 0 refills | Status: AC
Start: 2024-10-24 — End: ?

## 2024-09-24 MED ORDER — FLUOXETINE HCL 20 MG PO CAPS
20.0000 mg | ORAL_CAPSULE | Freq: Every day | ORAL | 1 refills | Status: AC
Start: 2024-09-24 — End: ?

## 2024-09-24 MED ORDER — AMPHETAMINE-DEXTROAMPHETAMINE 20 MG PO TABS: 10.0000 mg | ORAL_TABLET | Freq: Every day | ORAL | 0 refills | Status: AC

## 2024-09-24 MED ORDER — LISDEXAMFETAMINE DIMESYLATE 50 MG PO CAPS: 50.0000 mg | ORAL_CAPSULE | Freq: Every day | ORAL | 0 refills | Status: AC

## 2024-09-24 MED ORDER — MONTELUKAST SODIUM 10 MG PO TABS: 10.0000 mg | ORAL_TABLET | Freq: Every day | ORAL | 3 refills | Status: AC

## 2024-09-24 MED ORDER — GABAPENTIN 300 MG PO CAPS: 300.0000 mg | ORAL_CAPSULE | Freq: Every day | ORAL | 3 refills | Status: AC

## 2024-09-24 MED ORDER — HYDROXYZINE PAMOATE 50 MG PO CAPS
50.0000 mg | ORAL_CAPSULE | Freq: Three times a day (TID) | ORAL | 3 refills | Status: AC | PRN
Start: 2024-09-24 — End: ?

## 2024-09-24 MED ORDER — VERAPAMIL HCL 120 MG PO TABS: 120.0000 mg | ORAL_TABLET | Freq: Every day | ORAL | 1 refills | Status: AC

## 2024-09-24 MED ORDER — ALBUTEROL SULFATE HFA 108 (90 BASE) MCG/ACT IN AERS
1.0000 | INHALATION_SPRAY | Freq: Four times a day (QID) | RESPIRATORY_TRACT | 11 refills | Status: AC | PRN
Start: 2024-09-24 — End: ?

## 2024-09-24 MED ORDER — CYCLOBENZAPRINE HCL 10 MG PO TABS: 10.0000 mg | ORAL_TABLET | Freq: Three times a day (TID) | ORAL | 3 refills | Status: AC | PRN

## 2024-09-24 MED ORDER — TRIAMTERENE-HCTZ 37.5-25 MG PO TABS: 1.0000 | ORAL_TABLET | Freq: Every day | ORAL | 1 refills | Status: AC

## 2024-09-24 NOTE — Assessment & Plan Note (Signed)
 Managed with Vyvanse  and Adderall, effective without significant side effects. - Continue Vyvanse  50 mg daily. - Continue Adderall 10-20mg  as needed in the afternoon.

## 2024-09-24 NOTE — Assessment & Plan Note (Signed)
 Blood pressure slightly elevated, currently on triamterene  with hydrochlorothiazide  and verapamil . No chest pain, dizziness, or lightheadedness. - Recheck of blood pressure 131/84. - Continue Maxide and Verapamil  as prescribed.  - Limit dietary sodium.  - Continue regular intentional exercise. - Continue weight loss efforts.  - Limit caffeine consumption.

## 2024-09-24 NOTE — Progress Notes (Signed)
 Established patient visit   History of Present Illness   Discussed the use of AI scribe software for clinical note transcription with the patient, who gave verbal consent to proceed.  History of Present Illness   [name] is a 48 year old female with heavy menstrual bleeding and iron  deficiency anemia who presents for management of her symptoms.  Abnormal uterine bleeding and iron  deficiency anemia - Heavy menstrual periods resulting in iron  deficiency anemia - Medroxyprogesterone  prescribed for bleeding, with some improvement - Not currently taking ferrous sulfate - Shortness of breath present, with history of iron  deficiency  Menopausal symptoms - Night sweats and hot flashes have started - Seeing abdominal weight gain despite regular exercise  Asthma - Managed with albuterol  inhaler as needed and Singulair  at night - Shortness of breath present at times but relieved with albuterol  use  Hypertension - Controlled with Maxzide 37.5-25mg  and verapamil  120mg  daily - Home blood pressure readings in the 130s/80s  Anxiety and depression - Fluoxetine  60mg  daily taken for anxiety and depression - Hydroxyzine  used as needed for anxiety or sleep  Bruxism - Mouth guard used at night - Flexeril  taken for muscle relaxation    Physical Exam   Physical Exam Constitutional:      General: She is not in acute distress.    Appearance: Normal appearance. She is not ill-appearing.  HENT:     Head: Normocephalic and atraumatic.     Right Ear: Tympanic membrane, ear canal and external ear normal. There is no impacted cerumen.     Left Ear: Tympanic membrane, ear canal and external ear normal. There is no impacted cerumen.     Nose: Nose normal. No congestion or rhinorrhea.     Mouth/Throat:     Mouth: Mucous membranes are moist.     Pharynx: No oropharyngeal exudate or posterior oropharyngeal erythema.  Eyes:     General: No scleral icterus.       Right eye: No  discharge.        Left eye: No discharge.     Extraocular Movements: Extraocular movements intact.     Conjunctiva/sclera: Conjunctivae normal.     Pupils: Pupils are equal, round, and reactive to light.  Neck:     Thyroid: No thyromegaly.     Vascular: No carotid bruit or JVD.     Trachea: Trachea normal.  Cardiovascular:     Rate and Rhythm: Normal rate and regular rhythm.     Pulses: Normal pulses.     Heart sounds: Normal heart sounds. No murmur heard.    No friction rub. No gallop.  Pulmonary:     Effort: Pulmonary effort is normal. No respiratory distress.     Breath sounds: Normal breath sounds. No wheezing.  Abdominal:     General: Bowel sounds are normal. There is no distension.     Palpations: Abdomen is soft.     Tenderness: There is no abdominal tenderness. There is no guarding.  Musculoskeletal:        General: Normal range of motion.     Cervical back: Normal range of motion and neck supple.  Lymphadenopathy:     Cervical: No cervical adenopathy.  Skin:    General: Skin is warm and dry.  Neurological:     Mental Status: She is alert and oriented to person, place, and time.     Cranial Nerves: No cranial nerve deficit.  Psychiatric:  Mood and Affect: Mood normal.        Behavior: Behavior normal.        Thought Content: Thought content normal.        Judgment: Judgment normal.    Assessment & Plan   Problem List Items Addressed This Visit       Cardiovascular and Mediastinum   HTN (hypertension)   Blood pressure slightly elevated, currently on triamterene  with hydrochlorothiazide  and verapamil . No chest pain, dizziness, or lightheadedness. - Recheck of blood pressure 131/84. - Continue Maxide and Verapamil  as prescribed.  - Limit dietary sodium.  - Continue regular intentional exercise. - Continue weight loss efforts.  - Limit caffeine consumption.       Relevant Medications   triamterene -hydrochlorothiazide  (MAXZIDE-25) 37.5-25 MG tablet    verapamil  (CALAN ) 120 MG tablet   Other Relevant Orders   CBC   CMP14+EGFR   Lipid panel     Respiratory   Allergic rhinitis   Well managed with Flonase , Singulair , and Claritin  use. No recent flares or current symptoms.  - Continue Singulair  10mg  nightly and Claritin  10mg  daily.  - Continue Flonase .      Relevant Medications   montelukast  (SINGULAIR ) 10 MG tablet   Asthma   Asthma managed with albuterol  inhaler as needed. Shortness of breath may be exacerbated by iron  deficiency and asthma. - Continue albuterol  inhaler as needed. - Continue Claritin  and Singulair  as prescribed.       Relevant Medications   albuterol  (VENTOLIN  HFA) 108 (90 Base) MCG/ACT inhaler   montelukast  (SINGULAIR ) 10 MG tablet     Digestive   Bruxism   Wearing a mouthguard at night which is helpful. Flexeril  10mg  nightly at bedtime is effective and well tolerated. - Continue mouthguard use. - Continue Flexeril  10mg  nightly as needed.          Genitourinary   DUB (dysfunctional uterine bleeding)   Heavy menstrual bleeding and iron  deficiency anemia likely due to uterine fibroids. Medroxyprogesterone  started with some improvement. Ankle swelling possibly related to hormonal treatment. - Continue medroxyprogesterone . - Monitor ankle swelling; manage with sodium restriction, elevation, compression socks if needed.        Other   Attention deficit hyperactivity disorder (ADHD), combined type - Primary   Managed with Vyvanse  and Adderall, effective without significant side effects. - Continue Vyvanse  50 mg daily. - Continue Adderall 10-20mg  as needed in the afternoon.      Relevant Medications   amphetamine -dextroamphetamine  (ADDERALL) 20 MG tablet   lisdexamfetamine (VYVANSE ) 50 MG capsule (Start on 11/23/2024)   lisdexamfetamine (VYVANSE ) 50 MG capsule (Start on 10/24/2024)   lisdexamfetamine (VYVANSE ) 50 MG capsule   GAD (generalized anxiety disorder)   Well managed with fluoxetine  and  hydroxyzine . Medicines effective and well tolerated. Mood stable. Denies SI/HI. - Continue fluoxetine  60mg  daily. - Continue hydroxyzine  50mg  TID as needed.      Relevant Medications   FLUoxetine  (PROZAC ) 20 MG capsule   FLUoxetine  (PROZAC ) 40 MG capsule   hydrOXYzine  (VISTARIL ) 50 MG capsule   Iron  deficiency anemia   Working with OB/GYN to address heavy menstrual bleeding that contributes to iron  deficiency. Prior history of low iron  requiring infusions. Recent ferritin level of 9.  - Recommend contacting hematology regarding possible need for repeat iron  infusions.  - Follow up with OB/GYN as instructed.       Menopausal symptoms   Experiencing night sweats, hot flashes, and weight changes consistent with perimenopause. Most bothered by sleep interruption with night sweats.  -  Increasing nightly gabapentin  to 300mg .       Psychophysiological insomnia   Insomnia managed with gabapentin  200mg  and hydroxyzine  50mg  nightly, aiding sleep. Medications well tolerated, effective. - Increasing gabapentin  to 300mg  nightly for sleep and potential benefit for menopausal symptoms.  - Continue hydroxyzine  as needed.      Other Visit Diagnoses       Colon cancer screening       Relevant Orders   Ambulatory referral to Gastroenterology     Need for pneumococcal 20-valent conjugate vaccination       Relevant Orders   Pneumococcal conjugate vaccine 20-valent (Prevnar 20) (Completed)      General Health Maintenance Due for pneumonia vaccine due to asthma, eligible for flu and COVID vaccines. Discussed benefits of pneumonia vaccine. - Administer pneumonia vaccine today. - Discuss flu and COVID vaccines eligibility, declined today.     Follow up   Return in about 6 months (around 03/25/2025) for ADHD follow up. __________________________________ Zada FREDRIK Palin, DNP, APRN, FNP-BC Primary Care and Sports Medicine [facility]

## 2024-09-24 NOTE — Assessment & Plan Note (Signed)
 Experiencing night sweats, hot flashes, and weight changes consistent with perimenopause. Most bothered by sleep interruption with night sweats.  - Increasing nightly gabapentin  to 300mg .

## 2024-09-24 NOTE — Assessment & Plan Note (Signed)
 Insomnia managed with gabapentin  200mg  and hydroxyzine  50mg  nightly, aiding sleep. Medications well tolerated, effective. - Increasing gabapentin  to 300mg  nightly for sleep and potential benefit for menopausal symptoms.  - Continue hydroxyzine  as needed.

## 2024-09-24 NOTE — Assessment & Plan Note (Signed)
 Wearing a mouthguard at night which is helpful. Flexeril  10mg  nightly at bedtime is effective and well tolerated. - Continue mouthguard use. - Continue Flexeril  10mg  nightly as needed.

## 2024-09-24 NOTE — Assessment & Plan Note (Signed)
 Asthma managed with albuterol  inhaler as needed. Shortness of breath may be exacerbated by iron  deficiency and asthma. - Continue albuterol  inhaler as needed. - Continue Claritin  and Singulair  as prescribed.

## 2024-09-24 NOTE — Assessment & Plan Note (Signed)
 Working with OB/GYN to address heavy menstrual bleeding that contributes to iron  deficiency. Prior history of low iron  requiring infusions. Recent ferritin level of 9.  - Recommend contacting hematology regarding possible need for repeat iron  infusions.  - Follow up with OB/GYN as instructed.

## 2024-09-24 NOTE — Assessment & Plan Note (Signed)
 Well managed with Flonase , Singulair , and Claritin  use. No recent flares or current symptoms.  - Continue Singulair  10mg  nightly and Claritin  10mg  daily.  - Continue Flonase .

## 2024-09-24 NOTE — Assessment & Plan Note (Signed)
 Well managed with fluoxetine  and hydroxyzine . Medicines effective and well tolerated. Mood stable. Denies SI/HI. - Continue fluoxetine  60mg  daily. - Continue hydroxyzine  50mg  TID as needed.

## 2024-09-24 NOTE — Assessment & Plan Note (Signed)
 Heavy menstrual bleeding and iron  deficiency anemia likely due to uterine fibroids. Medroxyprogesterone  started with some improvement. Ankle swelling possibly related to hormonal treatment. - Continue medroxyprogesterone . - Monitor ankle swelling; manage with sodium restriction, elevation, compression socks if needed.

## 2024-09-25 ENCOUNTER — Ambulatory Visit: Payer: Self-pay | Admitting: Medical-Surgical

## 2024-09-25 LAB — CBC
Hematocrit: 36.6 % (ref 34.0–46.6)
Hemoglobin: 11 g/dL — ABNORMAL LOW (ref 11.1–15.9)
MCH: 23.6 pg — ABNORMAL LOW (ref 26.6–33.0)
MCHC: 30.1 g/dL — ABNORMAL LOW (ref 31.5–35.7)
MCV: 78 fL — ABNORMAL LOW (ref 79–97)
Platelets: 449 x10E3/uL (ref 150–450)
RBC: 4.67 x10E6/uL (ref 3.77–5.28)
RDW: 19 % — ABNORMAL HIGH (ref 11.7–15.4)
WBC: 7.2 x10E3/uL (ref 3.4–10.8)

## 2024-09-25 LAB — CMP14+EGFR
ALT: 11 IU/L (ref 0–32)
AST: 19 IU/L (ref 0–40)
Albumin: 4.5 g/dL (ref 3.9–4.9)
Alkaline Phosphatase: 105 IU/L (ref 41–116)
BUN/Creatinine Ratio: 21 (ref 9–23)
BUN: 16 mg/dL (ref 6–24)
Bilirubin Total: 0.4 mg/dL (ref 0.0–1.2)
CO2: 20 mmol/L (ref 20–29)
Calcium: 9.6 mg/dL (ref 8.7–10.2)
Chloride: 100 mmol/L (ref 96–106)
Creatinine, Ser: 0.75 mg/dL (ref 0.57–1.00)
Globulin, Total: 2.9 g/dL (ref 1.5–4.5)
Glucose: 89 mg/dL (ref 70–99)
Potassium: 4 mmol/L (ref 3.5–5.2)
Sodium: 137 mmol/L (ref 134–144)
Total Protein: 7.4 g/dL (ref 6.0–8.5)
eGFR: 100 mL/min/1.73 (ref >59)

## 2024-09-25 LAB — LIPID PANEL
Chol/HDL Ratio: 2.9 ratio (ref 0.0–4.4)
Cholesterol, Total: 203 mg/dL — ABNORMAL HIGH (ref 100–199)
HDL: 70 mg/dL (ref >39)
LDL Chol Calc (NIH): 114 mg/dL — ABNORMAL HIGH (ref 0–99)
Triglycerides: 109 mg/dL (ref 0–149)
VLDL Cholesterol Cal: 19 mg/dL (ref 5–40)

## 2024-10-08 ENCOUNTER — Other Ambulatory Visit

## 2024-10-08 DIAGNOSIS — Z006 Encounter for examination for normal comparison and control in clinical research program: Secondary | ICD-10-CM

## 2024-10-20 LAB — GENECONNECT MOLECULAR SCREEN: Genetic Analysis Overall Interpretation: NEGATIVE

## 2024-12-26 ENCOUNTER — Telehealth: Payer: Self-pay | Admitting: Pharmacy Technician

## 2024-12-26 ENCOUNTER — Other Ambulatory Visit (HOSPITAL_COMMUNITY): Payer: Self-pay

## 2024-12-26 ENCOUNTER — Encounter: Payer: Self-pay | Admitting: Family

## 2024-12-26 NOTE — Telephone Encounter (Signed)
 Pharmacy Patient Advocate Encounter   Received notification from [employer] CMM KEY that prior authorization for Lisdexamfetamine  Dimesylate 50MG  capsules is required/requested.   Insurance verification completed.   The patient is insured through [employer] [facility]   Per test claim: PA required and submitted KEY/EOC/Request #: [id number] from 12/26/24 to 12/26/27. Ran test claim, Copay is $259.02. This test claim was processed through [facility]- copay amounts may vary at other pharmacies due to pharmacy/plan contracts, or as the patient moves through the different stages of their insurance plan.

## 2025-03-25 ENCOUNTER — Ambulatory Visit: Admitting: Medical-Surgical
